# Patient Record
Sex: Female | Born: 1980 | Race: Black or African American | Hispanic: No | Smoking: Current some day smoker
Health system: Southern US, Community
[De-identification: ages and names within clinical notes are randomized; demographics above are authoritative.]

## PROBLEM LIST (undated history)

## (undated) DIAGNOSIS — I5189 Other ill-defined heart diseases: Secondary | ICD-10-CM

## (undated) DIAGNOSIS — D509 Iron deficiency anemia, unspecified: Secondary | ICD-10-CM

## (undated) DIAGNOSIS — D649 Anemia, unspecified: Secondary | ICD-10-CM

## (undated) DIAGNOSIS — I160 Hypertensive urgency: Secondary | ICD-10-CM

## (undated) DIAGNOSIS — R200 Anesthesia of skin: Secondary | ICD-10-CM

## (undated) DIAGNOSIS — K219 Gastro-esophageal reflux disease without esophagitis: Secondary | ICD-10-CM

## (undated) DIAGNOSIS — I10 Essential (primary) hypertension: Secondary | ICD-10-CM

## (undated) DIAGNOSIS — O1490 Unspecified pre-eclampsia, unspecified trimester: Secondary | ICD-10-CM

## (undated) HISTORY — PX: TUBAL LIGATION: SHX77

## (undated) HISTORY — PX: OTHER SURGICAL HISTORY: SHX169

---

## 2002-03-19 ENCOUNTER — Ambulatory Visit (HOSPITAL_COMMUNITY): Admission: RE | Admit: 2002-03-19 | Discharge: 2002-03-19 | Payer: Self-pay | Admitting: *Deleted

## 2002-03-19 ENCOUNTER — Encounter: Payer: Self-pay | Admitting: *Deleted

## 2002-04-16 ENCOUNTER — Ambulatory Visit (HOSPITAL_COMMUNITY): Admission: AD | Admit: 2002-04-16 | Discharge: 2002-04-16 | Payer: Self-pay | Admitting: *Deleted

## 2002-04-26 ENCOUNTER — Ambulatory Visit (HOSPITAL_COMMUNITY): Admission: AD | Admit: 2002-04-26 | Discharge: 2002-04-26 | Payer: Self-pay | Admitting: Obstetrics and Gynecology

## 2002-07-21 ENCOUNTER — Observation Stay (HOSPITAL_COMMUNITY): Admission: AD | Admit: 2002-07-21 | Discharge: 2002-07-21 | Payer: Self-pay | Admitting: *Deleted

## 2002-07-29 ENCOUNTER — Inpatient Hospital Stay (HOSPITAL_COMMUNITY): Admission: AD | Admit: 2002-07-29 | Discharge: 2002-08-01 | Payer: Self-pay | Admitting: *Deleted

## 2002-10-07 ENCOUNTER — Encounter: Payer: Self-pay | Admitting: Internal Medicine

## 2002-10-07 ENCOUNTER — Emergency Department (HOSPITAL_COMMUNITY): Admission: EM | Admit: 2002-10-07 | Discharge: 2002-10-07 | Payer: Self-pay | Admitting: Internal Medicine

## 2002-12-21 ENCOUNTER — Emergency Department (HOSPITAL_COMMUNITY): Admission: EM | Admit: 2002-12-21 | Discharge: 2002-12-21 | Payer: Self-pay | Admitting: Emergency Medicine

## 2002-12-21 ENCOUNTER — Encounter: Payer: Self-pay | Admitting: Emergency Medicine

## 2006-02-26 ENCOUNTER — Emergency Department (HOSPITAL_COMMUNITY): Admission: EM | Admit: 2006-02-26 | Discharge: 2006-02-26 | Payer: Self-pay | Admitting: Emergency Medicine

## 2007-02-13 ENCOUNTER — Ambulatory Visit (HOSPITAL_COMMUNITY): Payer: Self-pay | Admitting: Obstetrics & Gynecology

## 2007-02-13 ENCOUNTER — Encounter (HOSPITAL_COMMUNITY): Admission: RE | Admit: 2007-02-13 | Discharge: 2007-02-25 | Payer: Self-pay | Admitting: Obstetrics & Gynecology

## 2007-03-10 ENCOUNTER — Emergency Department (HOSPITAL_COMMUNITY): Admission: EM | Admit: 2007-03-10 | Discharge: 2007-03-10 | Payer: Self-pay | Admitting: Emergency Medicine

## 2007-03-29 ENCOUNTER — Inpatient Hospital Stay (HOSPITAL_COMMUNITY): Admission: AD | Admit: 2007-03-29 | Discharge: 2007-03-29 | Payer: Self-pay | Admitting: Gynecology

## 2007-04-02 ENCOUNTER — Inpatient Hospital Stay (HOSPITAL_COMMUNITY): Admission: EM | Admit: 2007-04-02 | Discharge: 2007-04-07 | Payer: Self-pay | Admitting: Obstetrics & Gynecology

## 2007-04-02 ENCOUNTER — Encounter: Payer: Self-pay | Admitting: Obstetrics & Gynecology

## 2008-03-12 ENCOUNTER — Emergency Department (HOSPITAL_COMMUNITY): Admission: EM | Admit: 2008-03-12 | Discharge: 2008-03-12 | Payer: Self-pay | Admitting: Emergency Medicine

## 2008-10-14 ENCOUNTER — Emergency Department (HOSPITAL_COMMUNITY): Admission: EM | Admit: 2008-10-14 | Discharge: 2008-10-14 | Payer: Self-pay | Admitting: Emergency Medicine

## 2010-06-08 ENCOUNTER — Other Ambulatory Visit
Admission: RE | Admit: 2010-06-08 | Discharge: 2010-06-08 | Payer: Self-pay | Source: Home / Self Care | Admitting: Obstetrics & Gynecology

## 2010-07-04 ENCOUNTER — Inpatient Hospital Stay (HOSPITAL_COMMUNITY)
Admission: AD | Admit: 2010-07-04 | Discharge: 2010-07-10 | DRG: 765 | Disposition: A | Discharge status: Home / Self Care | Payer: Medicaid Other | Source: Ambulatory Visit | Attending: Obstetrics and Gynecology | Admitting: Obstetrics and Gynecology

## 2010-07-04 ENCOUNTER — Inpatient Hospital Stay (HOSPITAL_COMMUNITY): Payer: Medicaid Other

## 2010-07-04 ENCOUNTER — Encounter (HOSPITAL_COMMUNITY): Payer: Self-pay

## 2010-07-04 DIAGNOSIS — IMO0002 Reserved for concepts with insufficient information to code with codable children: Principal | ICD-10-CM | POA: Diagnosis present

## 2010-07-04 DIAGNOSIS — O34219 Maternal care for unspecified type scar from previous cesarean delivery: Secondary | ICD-10-CM

## 2010-07-04 DIAGNOSIS — I10 Essential (primary) hypertension: Secondary | ICD-10-CM

## 2010-07-04 LAB — CBC
HCT: 26.8 % — ABNORMAL LOW (ref 36.0–46.0)
Hemoglobin: 8.8 g/dL — ABNORMAL LOW (ref 12.0–15.0)
MCH: 26.1 pg (ref 26.0–34.0)
MCHC: 32.8 g/dL (ref 30.0–36.0)
MCV: 79.5 fL (ref 78.0–100.0)
Platelets: 161 10*3/uL (ref 150–400)
RBC: 3.37 MIL/uL — ABNORMAL LOW (ref 3.87–5.11)
RDW: 14 % (ref 11.5–15.5)
WBC: 6.1 10*3/uL (ref 4.0–10.5)

## 2010-07-04 LAB — COMPREHENSIVE METABOLIC PANEL
ALT: 13 U/L (ref 0–35)
AST: 25 U/L (ref 0–37)
Albumin: 2.6 g/dL — ABNORMAL LOW (ref 3.5–5.2)
CO2: 20 mEq/L (ref 19–32)
Chloride: 108 mEq/L (ref 96–112)
GFR calc Af Amer: >60 mL/min (ref >60)
GFR calc non Af Amer: >60 mL/min (ref >60)
Sodium: 135 mEq/L (ref 135–145)
Total Bilirubin: 0.2 mg/dL — ABNORMAL LOW (ref 0.3–1.2)

## 2010-07-04 LAB — PROTEIN / CREATININE RATIO, URINE: Protein Creatinine Ratio: 0.18 — ABNORMAL HIGH (ref 0.00–0.15)

## 2010-07-04 LAB — RPR: RPR Ser Ql: NONREACTIVE

## 2010-07-05 ENCOUNTER — Inpatient Hospital Stay (HOSPITAL_COMMUNITY): Payer: Medicaid Other

## 2010-07-05 DIAGNOSIS — O34219 Maternal care for unspecified type scar from previous cesarean delivery: Secondary | ICD-10-CM

## 2010-07-05 DIAGNOSIS — IMO0002 Reserved for concepts with insufficient information to code with codable children: Secondary | ICD-10-CM

## 2010-07-05 DIAGNOSIS — I10 Essential (primary) hypertension: Secondary | ICD-10-CM

## 2010-07-05 LAB — CBC
HCT: 24.3 % — ABNORMAL LOW (ref 36.0–46.0)
Hemoglobin: 7.9 g/dL — ABNORMAL LOW (ref 12.0–15.0)
MCH: 26 pg (ref 26.0–34.0)
MCHC: 32.5 g/dL (ref 30.0–36.0)
RBC: 3.04 MIL/uL — ABNORMAL LOW (ref 3.87–5.11)

## 2010-07-05 LAB — COMPREHENSIVE METABOLIC PANEL
ALT: 10 U/L (ref 0–35)
AST: 21 U/L (ref 0–37)
CO2: 22 mEq/L (ref 19–32)
Calcium: 8 mg/dL — ABNORMAL LOW (ref 8.4–10.5)
Chloride: 109 mEq/L (ref 96–112)
GFR calc Af Amer: >60 mL/min (ref >60)
GFR calc non Af Amer: >60 mL/min (ref >60)
Glucose, Bld: 93 mg/dL (ref 70–99)
Sodium: 137 mEq/L (ref 135–145)
Total Bilirubin: 0.2 mg/dL — ABNORMAL LOW (ref 0.3–1.2)

## 2010-07-05 LAB — MRSA PCR SCREENING: MRSA by PCR: NEGATIVE

## 2010-07-05 LAB — CREATININE CLEARANCE, URINE, 24 HOUR: Creatinine Clearance: 178 mL/min — ABNORMAL HIGH (ref 75–115)

## 2010-07-06 ENCOUNTER — Inpatient Hospital Stay (HOSPITAL_COMMUNITY): Payer: Medicaid Other

## 2010-07-06 DIAGNOSIS — O34219 Maternal care for unspecified type scar from previous cesarean delivery: Secondary | ICD-10-CM

## 2010-07-06 DIAGNOSIS — IMO0002 Reserved for concepts with insufficient information to code with codable children: Secondary | ICD-10-CM

## 2010-07-06 DIAGNOSIS — I10 Essential (primary) hypertension: Secondary | ICD-10-CM

## 2010-07-06 LAB — PROTEIN, URINE, 24 HOUR
Collection Interval-UPROT: 24 hours
Protein, 24H Urine: 117 mg/d — ABNORMAL HIGH (ref 50–100)
Protein, Urine: 9 mg/dL
Urine Total Volume-UPROT: 1300 mL

## 2010-07-07 ENCOUNTER — Other Ambulatory Visit (HOSPITAL_COMMUNITY): Payer: Medicaid Other

## 2010-07-07 DIAGNOSIS — I10 Essential (primary) hypertension: Secondary | ICD-10-CM

## 2010-07-07 DIAGNOSIS — IMO0002 Reserved for concepts with insufficient information to code with codable children: Secondary | ICD-10-CM

## 2010-07-07 DIAGNOSIS — O34219 Maternal care for unspecified type scar from previous cesarean delivery: Secondary | ICD-10-CM

## 2010-07-07 DIAGNOSIS — Z302 Encounter for sterilization: Secondary | ICD-10-CM

## 2010-07-07 LAB — CBC
HCT: 28.8 % — ABNORMAL LOW (ref 36.0–46.0)
MCH: 26.3 pg (ref 26.0–34.0)
MCHC: 32.1 g/dL (ref 30.0–36.0)
MCV: 79.8 fL (ref 78.0–100.0)
Platelets: 149 10*3/uL — ABNORMAL LOW (ref 150–400)
RBC: 3.61 MIL/uL — ABNORMAL LOW (ref 3.87–5.11)
RDW: 14.8 % (ref 11.5–15.5)
RDW: 14.5 % (ref 11.5–15.5)
WBC: 6.2 10*3/uL (ref 4.0–10.5)
WBC: 8.3 10*3/uL (ref 4.0–10.5)

## 2010-07-07 LAB — COMPREHENSIVE METABOLIC PANEL
ALT: 11 U/L (ref 0–35)
AST: 23 U/L (ref 0–37)
Albumin: 2.2 g/dL — ABNORMAL LOW (ref 3.5–5.2)
Calcium: 7.7 mg/dL — ABNORMAL LOW (ref 8.4–10.5)
GFR calc Af Amer: >60 mL/min (ref >60)
Glucose, Bld: 92 mg/dL (ref 70–99)
Potassium: 3.4 mEq/L — ABNORMAL LOW (ref 3.5–5.1)
Sodium: 134 mEq/L — ABNORMAL LOW (ref 135–145)
Total Protein: 6.1 g/dL (ref 6.0–8.3)

## 2010-07-07 LAB — DIFFERENTIAL
Eosinophils Relative: 1 % (ref 0–5)
Lymphocytes Relative: 17 % (ref 12–46)
Lymphs Abs: 1.4 10*3/uL (ref 0.7–4.0)
Monocytes Relative: 4 % (ref 3–12)

## 2010-07-07 LAB — PREPARE RBC (CROSSMATCH)

## 2010-07-08 DIAGNOSIS — O34219 Maternal care for unspecified type scar from previous cesarean delivery: Secondary | ICD-10-CM

## 2010-07-08 DIAGNOSIS — IMO0002 Reserved for concepts with insufficient information to code with codable children: Secondary | ICD-10-CM

## 2010-07-08 DIAGNOSIS — I10 Essential (primary) hypertension: Secondary | ICD-10-CM

## 2010-07-08 LAB — CBC
Hemoglobin: 9.1 g/dL — ABNORMAL LOW (ref 12.0–15.0)
MCH: 26.4 pg (ref 26.0–34.0)
MCHC: 32.9 g/dL (ref 30.0–36.0)
MCV: 80.3 fL (ref 78.0–100.0)
RBC: 3.45 MIL/uL — ABNORMAL LOW (ref 3.87–5.11)

## 2010-07-09 DIAGNOSIS — I10 Essential (primary) hypertension: Secondary | ICD-10-CM

## 2010-07-09 DIAGNOSIS — O34219 Maternal care for unspecified type scar from previous cesarean delivery: Secondary | ICD-10-CM

## 2010-07-09 DIAGNOSIS — IMO0002 Reserved for concepts with insufficient information to code with codable children: Secondary | ICD-10-CM

## 2010-07-10 DIAGNOSIS — O34219 Maternal care for unspecified type scar from previous cesarean delivery: Secondary | ICD-10-CM

## 2010-07-10 DIAGNOSIS — I10 Essential (primary) hypertension: Secondary | ICD-10-CM

## 2010-07-10 DIAGNOSIS — IMO0002 Reserved for concepts with insufficient information to code with codable children: Secondary | ICD-10-CM

## 2010-07-10 LAB — TYPE AND SCREEN
ABO/RH(D): A POS
Unit division: 0

## 2010-07-17 NOTE — Op Note (Signed)
[name], [name]                ACCOUNT NO.:  [id number]  MEDICAL RECORD NO.:  [id number]         PATIENT TYPE:  WINP  LOCATION:  159                           FACILITY:  [facility]  PHYSICIAN:  Maryelizabeth Kaufmann, MD  DATE OF BIRTH:  1981/05/22  DATE OF PROCEDURE:  07/07/2010 DATE OF DISCHARGE:                              OPERATIVE REPORT   PREOPERATIVE DIAGNOSES: 1. Intrauterine pregnancy at 36 weeks and 0 days. 2. Previous cesarean section  x2. 3. Desired permanent sterilization. 4. Chronic hypertension with superimposed severe preeclampsia.  POSTOPERATIVE DIAGNOSES: 1. Intrauterine pregnancy at 36 weeks and 0 days. 2. Previous cesarean section  x2. 3. Desired permanent sterilization. 4. Chronic hypertension with superimposed severe preeclampsia.  PROCEDURE:  Repeat low transverse cesarean section with bilateral tubal occlusion with Filshie clips via Pfannenstiel skin incision.  SURGEON:  Dr. Marice Potter and Dr. Orvan Falconer.  ANESTHESIA:  Spinal.  IV FLUIDS:  800 mL.  URINE OUTPUT:  850 mL of clear urine at the end of procedure.  ESTIMATED BLOOD LOSS:  500 mL.  SPECIMENS:  Placenta that was sent to Labor and Delivery.  FINDINGS:  A viable female infant in vertex presentation with clear amniotic fluid.  Normal uterus, tubes, and ovaries.  Apgars were 6 and 9 and weight was 6 pounds 12 ounces.  INDICATIONS:  This is a 30 year old gravida 3, para 2-0-0-2 with history of previous C-section x2 presenting with chronic hypertension.  The patient was admitted for observation for rule out superimposed preeclampsia.  We did have difficult to control blood pressures in which a third agent was added.  BPP remained stable at 8/8.  Amniotic fluid index remained normal.  Estimated fetal weight was greater than the 90th percentile and was estimated to be 7 pounds and 7 ounces.  Subsequently, the patient did develop worsening symptoms despite adequate medication on 3 agents.  Her systolic went  up to 215/115 diastolic, decision was made at that time that the patient was severe preeclampsia.  The patient was started on magnesium for seizure prophylaxis, due to the patient's previous history of previous C-section x2.  She decided she went to proceed with a repeat C-section and also wanted a bilateral tubal occlusion.  Risks, benefits, and alternatives were discussed with the patient including but not limited to the risk of bleeding, pain, infection, and the risk of failure and ectopic and she decided to proceed with the aforementioned procedures.  PROCEDURE IN DETAIL:  After the patient was taken back to the OR suite and anesthesia was found to be adequate, the patient was placed in dorsal supine position with a leftward tilt.  The patient was prepped and draped in a normal sterile fashion.  A Pfannenstiel skin incision was then made with a scalpel and it was carried down through the underlying fascia.  The fascia was then incised in the midline.  The fascial incision was then extended laterally with the Mayo scissors. Then using the cautery, the rectus muscles were then incised in a transverse fashion approximately midway, the peritoneum was then identified and was entered bluntly.  The rectus muscles and peritoneum were then entered bluntly  and incision was made.  The bladder blade was then inserted.  The lower uterine segment was incised in a transverse fashion with a scalpel.  Amniotomy was then performed with clear fluid. Infant was found to be in vertex presentation, infant was delivered otherwise atraumatically.  Mouth and nose were bulb suctioned.  Cord was clamped and handed off to awaiting NICU.  Apgars were 6 and 9.  Weight was 6 pounds 12 ounces.  Cord blood was sampled.  Placenta was then delivered otherwise spontaneously intact with a three-vessel cord.  The uterus was then cleared of all clots and debris.  The uterine incision was then closed with a 2-0 Vicryl in a  running locking fashion. Hemostasis was then found to be adequate.  Attention was then turned to bilateral adnexa, at which time the right adnexum was then visualized. The right fallopian tube was then grasped with a Babcock clamp and it was followed out to the fimbriae and a Filshie clip was then deployed in the isthmic region and fallopian tube on the right was then returned to the abdomen.  Attention was then turned to the left fallopian tube which in similar fashion was grasped with a Babcock clamp and was followed out the fimbriae and the Filshie clip was deployed in the isthmic region.   The uterus and tube on that side was also returned to the abdomen.  Attention was then turned to the uterine incision which required an additional second layer closure.  Hemostasis was then found to be adequate using the same 2-0 Vicryl in a running locking fashion.  The peritoneum was then cleared of all clots and debris.  The fascial incision was then closed with a PDS in a running fashion.  The subcutaneous tissue was then irrigated and was found to be hemostatic.  The skin was then closed.  Additional local anesthesia was then used with 0.5% Marcaine in subcuticular region for additional local hemostasis.  The subcutaneous tissue was then closed with Vicryl in a subcutaneous fashion.  Lap, needle, and sponge counts were correct x2.  The patient tolerated the procedure well.  There were no complications.  The patient did receive perioperative antibiotics, and the patient was taken back to the recovery area in stable condition.          ______________________________ Maryelizabeth Kaufmann, MD     LC/MEDQ  D:  07/07/2010  T:  07/07/2010  Job:  [id number]  Electronically Signed by Maryelizabeth Kaufmann MD on 07/17/2010 12:32:11 PM

## 2010-07-27 NOTE — Discharge Summary (Signed)
  [name], [name]                ACCOUNT NO.:  [id number]  MEDICAL RECORD NO.:  [id number]           PATIENT TYPE:  I  LOCATION:  9130                          [facility]ACILITY:  [facility]  PHYSICIAN:  Tilda Burrow, M.D. DATE OF BIRTH:  [birth year]  DATE OF ADMISSION:  07/04/2010 DATE OF DISCHARGE:  07/10/2010                              DISCHARGE SUMMARY   REASON FOR ADMISSION:  Pregnancy at 36 weeks with uncontrolled hypertension and superimposed preeclampsia.  [name] was placed on the antenatal unit in attempt to assess her chronic hypertension and her superimposed preeclampsia.  After several days of pressures in 200s with diastolic of 96-120s and change in medicine regimen.  It was felt that delivery needed to be done for the patient well being.  At that time, the patient's blood pressure was in the severe range.  Cesarean section was decided by Dr. Marice Potter.  The patient had a low-transverse cesarean section and bilateral tubal ligation with Filshie clips by Dr. Marice Potter and Dr. Orvan Falconer on the July 07, 2010, uncomplicated, had a female child with Apgars of 6 and 9.  Postop course has been complicated by uncontrolled hypertension, which now with medication change pressures are down to the 130s to 150s over 80s to low 100s depending on her activity level.  She is up ambulating well, voiding without difficulty. Denies complaints and desires discharge home today.  DISCHARGE MEDICATIONS: 1. Hydrochlorothiazide 25 p.o. q.a.m. 2. Lisinopril 20 mg p.o. daily. 3. Norvasc 10 p.o. daily. 4. Metoprolol 100 mg b.i.d. 5. Percocet 5/325 p.o. q.4 h. p.r.n. pain. 6. Motrin p.o. q.6 h. p.r.n. cramping.  FOLLOWUP:  She is to follow up with Dr. Rayna Sexton office in a week for blood pressure check and also appearing any problems.  ACTIVITY LEVEL:  No heavy lifting or driving for at least 2 weeks postop.  DIET:  Diet as tolerated.  PHYSICAL EXAMINATION:  VITAL SIGNS:  Today, BP is 150/80. HEART:   Regular rhythm and rate. LUNGS:  Clear to auscultation bilaterally. ABDOMEN:  Soft.  Bowel sounds present in all four quadrants.  Incision is intact.  There is no redness or drainage.  Fundus is firm.  Lochia small amount.  There is trace edema in lower extremities.     Zerita Boers, N.M.   ______________________________ Tilda Burrow, M.D.    DL/MEDQ  D:  16/02/9603  T:  07/10/2010  Job:  [id number]  cc:   [facility]  Electronically Signed by Wyvonnia Dusky N.M. on 07/23/2010 10:29:18 AM Electronically Signed by Christin Bach M.D. on 07/27/2010 08:44:06 PM

## 2010-08-04 ENCOUNTER — Inpatient Hospital Stay (HOSPITAL_COMMUNITY): Admission: AD | Admit: 2010-08-04 | Payer: Self-pay | Admitting: Obstetrics & Gynecology

## 2010-10-10 NOTE — Discharge Summary (Signed)
[name], [name]                ACCOUNT NO.:  [id number]   MEDICAL RECORD NO.:  [id number]          PATIENT TYPE:  INP   LOCATION:  9125                          [facility]ILITY:  [facility]   PHYSICIAN:  Tilda Burrow, M.D. DATE OF BIRTH:  25-Jun-1980   DATE OF ADMISSION:  04/02/2007  DATE OF DISCHARGE:                               DISCHARGE SUMMARY   REASON FOR ADMISSION:  Pregnancy at 40 weeks, 1 day with chronic  hypertension for repeat Cesarean section.   PROCEDURE:  The patient had a repeat low transverse Cesarean section  without complications.   HOSPITAL COURSE:  Hospital course has been complicated by difficulty  managing blood pressures.  Her pressures have been anywhere from 150's  to 160's/low 100's.  On April 06, 2007 her medication was changed from  Aldomet to lisinopril 10 mg p.o. q.a.m. and the patient has responded  much better with that.  On physical examination today her blood pressure  is 140's/80's.  Heart is regular to rhythm and rate.  Her lungs are  clear to auscultation bilaterally.  Abdomen is soft, nontender.  Bowel  sounds are present.  Incision is intact.  No redness, swelling or  drainage.  There is a scant amount of lochia.  There is no edema in the  lower extremities.  Deep tendon reflexes are 2+.   PLAN:  We are going to discharge her home to follow up in one week with  [facility] for staple removal and blood pressure evaluation.   DISCHARGE MEDICATIONS:  Her discharge medications are as follows:  1. Lisinopril 10 mg p.o. q.a.m.  2. Hydrochlorothiazide 25 mg p.o. q.a.m.  3. Norvasc 5 mg p.o. q.h.s.  4. Percocet 5/325 one q.4h. p.r.n. pain.  5. Motrin 600 mg one p.o. q.6h. p.r.n. discomfort.  6. Colace two p.o. q.h.s.      Zerita Boers, Lanier Clam      Tilda Burrow, M.D.  Electronically Signed    DL/MEDQ  D:  04/54/0981  T:  04/07/2007  Job:  [id number]

## 2010-10-10 NOTE — Op Note (Signed)
[name], [name]                ACCOUNT NO.:  [id number]   MEDICAL RECORD NO.:  [id number]          PATIENT TYPE:  INP   LOCATION:  9125                          [facility]  [facility]   PHYSICIAN:  Lazaro Arms, M.D.   DATE OF BIRTH:  12-27-1980   DATE OF PROCEDURE:  04/02/2007  DATE OF DISCHARGE:                               OPERATIVE REPORT   PREOPERATIVE DIAGNOSES:  1. Intrauterine pregnancy at 40-1/[redacted] weeks gestation.  2. Chronic hypertension.  3. Previous cesarean section.  4. Declines trial of labor with unfavorable cervix.   POSTOPERATIVE DIAGNOSES:  1. Intrauterine pregnancy at 40-1/[redacted] weeks gestation.  2. Chronic hypertension.  3. Previous cesarean section.  4. Declines trial of labor with unfavorable cervix.   OPERATION PERFORMED:  Repeat cesarean section.   SURGEON:  Lazaro Arms, M.D.   ASSISTANT:  Tyrone Apple. Sharol Harness, M.D.   ANESTHESIA:  Spinal.   FINDINGS:  Over a low transverse hysterotomy incision was delivered a  viable female infant at 30 with Apgars of 6 and 8, weighing 7 pounds and  4 ounces, three-vessel cord, cord blood was sent.  Cord gas pH 7.26.  Uterus, tubes and ovaries normal.   DESCRIPTION OF PROCEDURE:  The patient was taken to the operating room  and placed in sitting position where she underwent spinal anesthetic.  She was then placed in supine position with a left uterine tilt where  she was tilted to her left.  She was prepped and draped in the usual  sterile fashion.  Foley catheter was placed.  A Pfannenstiel skin  incision was made and carried down sharply.  Rectus fascia was scored in  the midline and extended laterally.  Fascia dissected off the muscles  superiorly and inferiorly without difficulty.  Muscles were divided.  Peritoneal cavity was entered.  Bladder flap was created.  A bladder  blade was placed.  A low transverse hysterotomy incision was made.  Over  this incision was delivered a viable female infant at 67 with Apgars  of  6 and 8, weighing 7 pounds, 4 ounces.  Cord gas was 7.26.  Three-vessel  cord, placenta was normal.  Uterus, tubes and ovaries were normal.  The  infant underwent routine neonatal resuscitation.  Placenta was delivered  spontaneously.  The uterus was clean with lap pad and exteriorized,  closed in two layers, first being running interlocking layer, the second  being an imbricating layer.  There was good hemostasis and the pelvis  was irrigated vigorously.  Muscles of the peritoneum were reapproximated  loosely.  The fascia was closed using 0 Vicryl running, subcutaneous  tissue  made hemostatic and irrigated.  Skin was closed using skin staples.  The  patient tolerated the procedure well.  She experienced 600 mL of blood  loss.  Taken to recovery room in good and stable condition.  All counts  correct at end of procedure.      Lazaro Arms, M.D.  Electronically Signed     LHE/MEDQ  D:  04/02/2007  T:  04/03/2007  Job:  [id number]

## 2010-10-10 NOTE — H&P (Signed)
[name], [name]                ACCOUNT NO.:  [id number]   MEDICAL RECORD NO.:  [id number]          PATIENT TYPE:  INP   LOCATION:  9125                          [facility]ILITY[facility]   PHYSICIAN:  Lazaro Arms, M.D.   DATE OF BIRTH:  Jul 09, 1980   DATE OF ADMISSION:  04/02/2007  DATE OF DISCHARGE:                              HISTORY & PHYSICAL   [name] is a 30 year old African American female, gravida 2, para 1,  abortus zero, estimated date of delivery April 01, 2007, chronic  hypertension who had a previous C-section and wanted to have a vaginal  birth after cesarean.  She had a C-section for a 7 pounds 6 ounces baby  before for failure to progress.  Her cervix was woefully unfavorable  this time.  We waited this long in order to see if she would go into  labor.  The baby is well out of the pelvis and I did not feel  comfortable inducing her.  And, she agreed with proceeding with a repeat  C-section.   PAST MEDICAL HISTORY:  Significant for hypertension.   PAST SURGERY:  1. Gallbladder.  2. C-section.   PAST OBSTETRICAL HISTORY:  C-section in 2004, 7 pounds 6 ounces.   REVIEW OF SYSTEMS:  Negative.   MEDICATIONS:  Aldomet 500 b.i.d. increased to 1,000 b.i.d. here in the  last 3 weeks.   Her protein has been trace.  Her 24-hour urine has been negative.   She was also anemic during the pregnancy and I gave her some IV iron  because she was unable to tolerate the oral.   Blood type is A positive.  Rubella is immune.  Anti__________  titer was  positive but the titer was less than 2 and that was repeated.  Hepatitis  B was negative.  HIV was nonreactive x2.  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  HPV was negative.  Serologies nonreactive x2.  Pap was normal.  GC and  Chlamydia were negative x2.  AFP, she was too late.  Group B strep was  positive.   PHYSICAL EXAMINATION:  HEENT:  Unremarkable.  Thyroid is normal.  LUNGS:  Clear.  HEART:  Regular rate and rhythm without murmur,  regurgitation, or  gallop.  BREASTS:  Deferred.  ABDOMEN:  Fundal height of 40-cm.  PELVIC:  Cervix is long, thick, closed, and high.  EXTREMITIES:  Warm with no edema.  NEUROLOGIC:  Grossly intact.   IMPRESSION:  1. Intrauterine pregnancy at 40 weeks and 1 day.  2. Chronic hypertension.  3. Previous cesarean section.   PLAN:  The patient is admitted for a repeat C-section.  She understands  risks, benefits, indications, and alternatives.  We will proceed.      Lazaro Arms, M.D.  Electronically Signed     LHE/MEDQ  D:  04/02/2007  T:  04/02/2007  Job:  [id number]
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
## 2010-10-13 NOTE — Op Note (Signed)
   NAME:  [name], [name]                          ACCOUNT NO.:  [id number]   MEDICAL RECORD NO.:  [id number]                   PATIENT TYPE:  INP   LOCATION:  LDR1                                 FACILITY:  [facility]   PHYSICIAN:  Langley Gauss, M.D.                DATE OF BIRTH:  10-30-1980   DATE OF PROCEDURE:  DATE OF DISCHARGE:                                 OPERATIVE REPORT   OBSTETRICAL PROCEDURE NOTE:   PROCEDURE:  Placement of a continuous lumbar epidural analgesia.   START TIME:  07/29/2002 at 2330.   SUMMARY:  An appropriate and informed consent was obtained.  Continuous  electronic fetal monitoring was performed.  The patient was placed in the  seated position at which time bony landmarks were identified.  The L3-L4  interspace was chosen.  The patient's back was sterilely prepped and draped  utilizing the epidural kit; 5 mL of 1% lidocaine plain was injected at the  midline of the L3-L4 interspace to raise a small skin wheal.   The 17-gauge Touhy-Schliff needle was then utilized with loss of resistance  in air-filled glass syringe to identify entry into the epidural space on the  first attempt without difficulty.  Excellent loss of resistance was noted.  Initial test dose of 5 mL of 1.5% lidocaine plus epinephrine is administered  as initial test dose.  No signs of CSF or intravascular injection obtained.  Thus, the epidural catheter was inserted to a depth of 5 cm.  Epidural  needle was removed.  Aspiration test was negative.  Second test dose of 2 mL  of 1.5% lidocaine plus epinephrine injected through the epidural catheter.  Again, no signs of CSF or intravascular injection obtained.   The patient having tingling in the feet consistent with proper setting up of  epidural block.  She is returned to the lateral supine position at which  time she is connected to the infusion pump containing the standard mixture.  She will be treated with a bolus of 10 mL followed by  continuous infusion  rate of 14 mL/hour.  Examination immediately following the procedure  indicates cervix 4 cm dilated, 70% effaced, vertex at a minus 1 station.  External fetal monitor reveals reassuring fetal heart rate.  Uterine  contractions are occurring every 3-5 minutes.  They are only 30 seconds  duration; thus, at this point in time, Pitocin augmentation will be  initiated.                                               Langley Gauss, M.D.    DC/MEDQ  D:  07/30/2002  T:  07/30/2002  Job:  [id number]
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
## 2010-10-13 NOTE — Op Note (Signed)
   NAME:  [name], [name]                          ACCOUNT NO.:  [id number]   MEDICAL RECORD NO.:  [id number]                   PATIENT TYPE:  INP   LOCATION:  LDR1                                 FACILITY:  [facility]   PHYSICIAN:  Langley Gauss, M.D.                DATE OF BIRTH:  1980-12-09   DATE OF PROCEDURE:  DATE OF DISCHARGE:                                 OPERATIVE REPORT   PROCEDURE:  Placement of a Foley bulb for mechanical ripening of cervix  prior to induction.   COMPLICATIONS:  None.   SUMMARY:  The patient was admitted for induction of labor for findings of  gestational hypertension with blood pressures of 140s/90s during the 37th  and 38th week of gestation.  Due to the unfavorable nature of the cervix, a  Foley bulb is placed for mechanical ripening of the cervix.  The cervix was  examined and noted to be 1 cm dilated, -1 station, 60% effaced.  External  fetal monitor reveals a reassuring fetal heart rate with a reactive  nonstress test.  No uterine activity was identified and normal uterine tone.   PROCEDURE:  The patient was placed in the dorsal lithotomy position.  Sterile speculum examination was performed.  The cervix was visualized.  There was noted to be no leakage of fluid and no vaginal bleeding.  A 16  French Foley catheter was then passed through the endocervical os into the  lower uterine segment and inflated to a volume of 50 cc of sterile normal  saline.  The speculum was then removed.  Gentle traction was applied to the  catheter, and it was taped to the patient's left leg.  Following the  procedure, the patient was reconnected to the fetal monitor.  Fetal heart  rate remained reassuring with fetal heart rate accelerations noted.  No  change in uterine tone, specifically no increased uterine activity, no  vaginal bleeding, and no leakage of fluid.                                               Langley Gauss, M.D.    DC/MEDQ  D:  07/29/2002  T:   07/29/2002  Job:  [id number]

## 2010-10-13 NOTE — Discharge Summary (Signed)
   NAME:  [name], KOTT NO.:  [id number]   MEDICAL RECORD NO.:  [id number]                   PATIENT TYPE:  OBV   LOCATION:  A415                                 FACILITY:  [facility]   PHYSICIAN:  Langley Gauss, M.D.                DATE OF BIRTH:  [birth year]   DATE OF ADMISSION:  04/16/2002  DATE OF DISCHARGE:  04/16/2002                                 DISCHARGE SUMMARY   HISTORY OF PRESENT ILLNESS:  This is a 29 year old gravida 1, para 0, at  about [redacted] weeks gestation who presents to [facility] complaining of  lower pelvic pain of several days' duration, slightly increasing today.  She  states that the pain is steady in nature, on the left greater than on the  right.  She denies any uterine-type contractions or menstrual-type cramping.  The patient's prenatal course otherwise is uncomplicated to date.  She  denies any vaginal bleeding or leakage of fluid.   PAST MEDICAL HISTORY:  Otherwise noncontributory.   PHYSICAL EXAMINATION:  GENERAL:  Patient in no acute distress.  Mild  distress with palpation of the lower uterine segment.  VITAL SIGNS:  Temperature 97.6, pulse 89, respiratory rate 20, blood  pressure 136/89.  ABDOMEN:  Uterus soft and nontender.  Fundal height consistent with 23  weeks' gestation.  PELVIC:  With normal external genitalia.  No lesions or ulcerations  identified.  Digital examination of the cervix shows it closed.  No leakage  of fluid.  No vaginal bleeding identified.  External fetal monitor reveals  fetal heart rate baseline 140-150, with complete absence of uterine  activity.   HOSPITAL COURSE:  The patient was noted to have the mild suprapubic pain on  examination.  She was treated with 2 p.o. Tylox x 1 with complete cessation  of discomfort.  She was observed for an additional several hours' duration  with no recurrence of the pain.  She was thus discharged to home on the same  date of service.                                    Langley Gauss, M.D.    DC/MEDQ  D:  05/03/2002  T:  05/04/2002  Job:  [id number]

## 2010-10-13 NOTE — Op Note (Signed)
   [name], [name]                            ACCOUNT NO.:  [id number]   MEDICAL RECORD NO.:  [id number]                  PATIENT TYPE:   LOCATION:  418                                  FACILITY:   PHYSICIAN:  Langley Gauss, M.D.                DATE OF BIRTH:   DATE OF PROCEDURE:  07/29/2002  DATE OF DISCHARGE:                                 OPERATIVE REPORT   PROCEDURE:  Removal of a Foley bulbar catheter from the cervix.   DESCRIPTION OF PROCEDURE:  The patient had the Foley bulb catheter placed  earlier today.  During the last hour, she has had the onset of uterine  contractions, occurring 2-3 minutes, so our reach is only lasting 30 seconds  in duration.  The heart rate remains reassuring.  There is no change in the  baseline uterine tone.  The patient had received IV Nubain and IV Phenergan  for discomfort after the Foley bulb had been placed.  I initially attempted  to remove the Foley bulb without removing any of the fluid; however, this  was unsuccessful, and I had to remove 10 mL, leaving a volume of 40 mL of  sterile normal saline within the Foley bulb.  This was then removed without  complication.  Cervical exam following removal, 3 cm dilated, -1 station,  70% effaced, with a vertex presentation palpable.  A fetal scalp electrode  is placed with resultant amniotomy and findings of clear amniotic fluid.  Fetal scalp electrode at this time documents a reassuring fetal heart rate.   PLAN:  The patient will be observed x 1 hour duration.  If adequate labor  does not ensue, we will proceed with Pitocin augmentation at this time.                                               Langley Gauss, M.D.    DC/MEDQ  D:  07/29/2002  T:  07/30/2002  Job:  [id number]

## 2010-10-13 NOTE — Op Note (Signed)
NAME:  [name], [name]                          ACCOUNT NO.:  [id number]   MEDICAL RECORD NO.:  [id number]                   PATIENT TYPE:  INP   LOCATION:  LDR1                                 FACILITY:  [facility]   PHYSICIAN:  Langley Gauss, M.D.                DATE OF BIRTH:  [birth year]   DATE OF PROCEDURE:  DATE OF DISCHARGE:                                 OPERATIVE REPORT   PREOPERATIVE DIAGNOSES:  1. A 38-week intrauterine pregnancy.  2. Gestational hypertension.  3. Fetal heart rate decelerations.   POSTOPERATIVE DIAGNOSES:  1. A 38-week intrauterine pregnancy.  2. Gestational hypertension.  3. Fetal heart rate decelerations.   PROCEDURE:  Primary low transverse cesarean section.   SURGEON:  Roylene Reason.  Lisette Grinder, M.D.   FINDINGS:  Delivered a 6 pound 3 ounce female infant.   ESTIMATED BLOOD LOSS:  700 mL.   ANESTHESIA:  Labor epidural which had been placed by Dr. Roylene Reason. Lisette Grinder.  It was then managed by Farris Has, CRNA for purposes of the operative  delivery.   DRAINS:  1. JP drain in the subcutaneous space.  2. Foley catheter to straight drainage.   PEDIATRICIAN:  Pediatric care provided by Dr. Tanja Port neonatologist.   INDICATIONS FOR PROCEDURE:  The patient had been admitted at [redacted] weeks  gestation for induction of labor secondary to findings of gestational  hypertension with blood pressure of 130/100.  Initially a Foley bulb  catheter had been placed for mechanical ripening of the cervix.  With  removal of the Foley catheter, thereafter performed amniotomy with findings  of clear amniotic fluid.  Fetal scalp electrode was placed.  Pitocin  augmentation was initiated due to inadequate frequency and intensity of  uterine contractions.  The patient had a reassuring fetal heart rate but  then began having some mild variable decelerations. When I examined the  patient she was noted to be 5 cm dilated, 70% effaced with the vertex at  minus 1 station, but not very  well applied to the cervix.  The patient,  thereafter began developing fetal heart rate decelerations with fetal heart  rate decelerating to 80-90 beats/minute in a variable-type deceleration with  a compensatory over shoot following this with a fetal heart rate of 190-200.  This continued and was watched for 12 minutes duration at which time it was  determined that the fetus was very poorly tolerant of distress which was  associated with labor, thus decision was made to proceed primary low  transverse cesarean section.   DESCRIPTION OF PROCEDURE:  The patient was taken to the operating room,  placed on the OR table with a slight left lateral tilt.  Foley catheter  continued to drain clear yellow urine.  After assurance of adequate surgical  analgesia, a sharp knife was used to incise the Pfannenstiel incision  through the skin, dissecting down to the fascial plane using a  sharp knife,  and cauterizing all bleeders along the way.  The fascia was then incised in  a transverse curvilinear manner utilizing the Mayo scissors.  The rectus  fascia was then dissected off the underlying rectus muscle in an avascular  plane.  The rectus fascial edges were then grasped using straight Kocher  clamps, dissected off the underlying rectus muscle in the midline both  superiorly and inferiorly.  The rectus muscle was bluntly separated.  The  peritoneal cavity was atraumatically, bluntly entered at the superior-most  portion of the incision.  The peritoneal incision then extended superiorly  and inferiorly; inferiorly we directly visualized the bladder to avoid its  accidental entry.   A bladder blade was then placed.  The lower uterine segment was noted to be  very well-developed.  A bladder flap was then created from the vesicouterine  fold in the avascular plane.  A sharp knife was then used to score a low  transverse uterine incision.  Amniotic sac was encountered in the midline.  Allis clamp was  used to rupture the sac with findings of clear amniotic  fluid.  No odor is identified.   My index finger is then used to bluntly extend the uterine incision  bilaterally.  The infant is noted to be in a cephalic presentation, but the  position is noted to be LOT.  My right hand then reached into the uterine  cavity and the head of the infant was flexed and elevated to the level of  the skin incision, at which time, the disposable suction was then placed on  the infant's vertex to allow suction.  Very gentle traction was applied  which resulted in easy deliver the vertex through the uterine incision  without difficulty.  The mouth and nares were bulb suctioned of clear  amniotic fluid.  Renewed gentle traction resulted in delivery of the  remainder of the infant through the uterine incision without difficulty.  The umbilical cord is then milked towards the infant.  The cord is doubly  clamped and cut and the infant is handed to the waiting neonatologist.  Arterial cord gas and cord blood are then obtained.   Gentle traction on the umbilical cord results in separation which, upon  examination, appears to be an intact 3-vessel cord and attached placenta.  The uterus is then exteriorized.  Intrauterine exploration reveals no  retained placental fragments. The tubes and ovaries noted to be normal in  appearance.  A very fine, filmy, adhesive disease was encountered in the  posterior aspect of the uterus which is dissected utilizing the cautery.  With the uterus exteriorized, uterine incision is noted not to have  extended.  This was easily closed utilizing 2 layers of #0 chromic in a  running lock fashion the second layer being an imbricating layer.  Small  bleeding is noted at the left apex, thus an additional figure-of-eight  suture is required here.   After assurance of adequate surgical analgesia along the uterine incision, the cul-de-sac was irrigated free of all clots.  The uterus was  returned to  the pelvic cavity.  The peritoneal edges were grasped using Kelly clamps.  The peritoneal gutters are irrigated free of all clots with a sterile saline  solution.  Sponge, needle, and instrument counts are correct x2, at this  point.  The peritoneum is then closed with a continuous running #0 chromic  suture. The rectus muscle is reapproximated utilizing continuous running #0  chromic suture.  No  subfascial bleeders are identified.  The fascia is then  closed with a continuous running, #1 PDS suture.  A JP drain is placed  within the subcutaneous space with a separate exit wound to the right apex  of the incision.  The JP drain was sutured into place.   Three interrupted #1 PDS sutures are then placed along the skin edges to  function as retention-type sutures; and the skin is completely closed  utilizing skin staples.  To facilitate postoperative analgesia a total of 25  mL of 0.5% bupivacaine plain is injected along the entirety of the uterine  incision.  The patient tolerated the procedure very well.  She continues to drain clear yellow urine.  She is taken to the recovery  room in stable condition.  She plans on bottle feeding.  The operative  findings discussed with the patient's awaiting family on the fourth floor.  The infant is doing well in the newborn nursery.                                               Langley Gauss, M.D.    DC/MEDQ  D:  07/30/2002  T:  07/30/2002  Job:  [id number]

## 2011-02-26 LAB — CBC
Hemoglobin: 13.3
MCHC: 33.8
RBC: 4.51
WBC: 5.1

## 2011-02-26 LAB — URINALYSIS, ROUTINE W REFLEX MICROSCOPIC
Bilirubin Urine: NEGATIVE
Glucose, UA: NEGATIVE
Ketones, ur: NEGATIVE
pH: 7

## 2011-02-26 LAB — DIFFERENTIAL
Lymphocytes Relative: 13
Lymphs Abs: 0.7
Monocytes Absolute: 0.3
Monocytes Relative: 6
Neutro Abs: 4.1
Neutrophils Relative %: 80 — ABNORMAL HIGH

## 2011-02-26 LAB — BASIC METABOLIC PANEL
CO2: 26
Calcium: 9.1
Creatinine, Ser: 0.75
GFR calc Af Amer: >60
Sodium: 138

## 2011-02-26 LAB — URINE MICROSCOPIC-ADD ON

## 2011-03-06 LAB — URINE MICROSCOPIC-ADD ON

## 2011-03-06 LAB — CBC
HCT: 27.3 — ABNORMAL LOW
HCT: 32.1 — ABNORMAL LOW
MCHC: 34.2
MCHC: 34.9
MCV: 84.6
MCV: 85.6
MCV: 85.7
MCV: 86.5
Platelets: 122 — ABNORMAL LOW
Platelets: 133 — ABNORMAL LOW
Platelets: 138 — ABNORMAL LOW
Platelets: 150
RBC: 3.17 — ABNORMAL LOW
RBC: 3.55 — ABNORMAL LOW
RBC: 3.74 — ABNORMAL LOW
RDW: 18 — ABNORMAL HIGH
WBC: 5.7
WBC: 5.9
WBC: 6
WBC: 7.6

## 2011-03-06 LAB — URINALYSIS, ROUTINE W REFLEX MICROSCOPIC
Bilirubin Urine: NEGATIVE
Glucose, UA: NEGATIVE
Hgb urine dipstick: NEGATIVE
Specific Gravity, Urine: 1.015
Urobilinogen, UA: 0.2
pH: 6.5

## 2011-03-06 LAB — TYPE AND SCREEN
ABO/RH(D): A POS
Antibody Screen: NEGATIVE

## 2011-03-06 LAB — COMPREHENSIVE METABOLIC PANEL
ALT: 15
AST: 19
AST: 26
Albumin: 2.4 — ABNORMAL LOW
Albumin: 2.8 — ABNORMAL LOW
Alkaline Phosphatase: 123 — ABNORMAL HIGH
BUN: 3 — ABNORMAL LOW
BUN: 5 — ABNORMAL LOW
CO2: 23
Calcium: 9
Calcium: 9
Chloride: 105
Chloride: 105
Chloride: 108
Creatinine, Ser: 0.46
Creatinine, Ser: 0.49
GFR calc Af Amer: >60
GFR calc non Af Amer: >60
Potassium: 3.9
Sodium: 137
Total Bilirubin: 0.3
Total Bilirubin: 0.3
Total Bilirubin: 0.5

## 2011-03-06 LAB — RAPID URINE DRUG SCREEN, HOSP PERFORMED
Amphetamines: NOT DETECTED
Benzodiazepines: NOT DETECTED

## 2011-03-06 LAB — URIC ACID: Uric Acid, Serum: 4.5

## 2011-03-06 LAB — RPR: RPR Ser Ql: NONREACTIVE

## 2011-03-06 LAB — PREGNANCY, URINE: Preg Test, Ur: POSITIVE

## 2011-03-13 ENCOUNTER — Encounter (HOSPITAL_COMMUNITY): Payer: Self-pay | Admitting: *Deleted

## 2014-03-29 ENCOUNTER — Encounter (HOSPITAL_COMMUNITY): Payer: Self-pay | Admitting: *Deleted

## 2014-09-26 ENCOUNTER — Observation Stay (HOSPITAL_COMMUNITY)
Admission: EM | Admit: 2014-09-26 | Discharge: 2014-09-28 | Disposition: A | Discharge status: Home / Self Care | Payer: Medicaid Other | Attending: Internal Medicine | Admitting: Internal Medicine

## 2014-09-26 ENCOUNTER — Encounter (HOSPITAL_COMMUNITY): Payer: Self-pay | Admitting: Emergency Medicine

## 2014-09-26 ENCOUNTER — Emergency Department (HOSPITAL_COMMUNITY): Payer: Medicaid Other

## 2014-09-26 DIAGNOSIS — R079 Chest pain, unspecified: Secondary | ICD-10-CM | POA: Diagnosis present

## 2014-09-26 DIAGNOSIS — R200 Anesthesia of skin: Secondary | ICD-10-CM | POA: Diagnosis not present

## 2014-09-26 DIAGNOSIS — Z720 Tobacco use: Secondary | ICD-10-CM | POA: Diagnosis not present

## 2014-09-26 DIAGNOSIS — I160 Hypertensive urgency: Secondary | ICD-10-CM

## 2014-09-26 DIAGNOSIS — I10 Essential (primary) hypertension: Secondary | ICD-10-CM | POA: Diagnosis not present

## 2014-09-26 DIAGNOSIS — G459 Transient cerebral ischemic attack, unspecified: Principal | ICD-10-CM | POA: Diagnosis present

## 2014-09-26 DIAGNOSIS — F419 Anxiety disorder, unspecified: Secondary | ICD-10-CM | POA: Diagnosis not present

## 2014-09-26 DIAGNOSIS — R51 Headache: Secondary | ICD-10-CM | POA: Diagnosis not present

## 2014-09-26 DIAGNOSIS — D509 Iron deficiency anemia, unspecified: Secondary | ICD-10-CM | POA: Diagnosis present

## 2014-09-26 DIAGNOSIS — R0602 Shortness of breath: Secondary | ICD-10-CM | POA: Insufficient documentation

## 2014-09-26 DIAGNOSIS — I5189 Other ill-defined heart diseases: Secondary | ICD-10-CM | POA: Diagnosis present

## 2014-09-26 DIAGNOSIS — R0789 Other chest pain: Secondary | ICD-10-CM | POA: Diagnosis present

## 2014-09-26 DIAGNOSIS — R55 Syncope and collapse: Secondary | ICD-10-CM | POA: Diagnosis present

## 2014-09-26 DIAGNOSIS — O1490 Unspecified pre-eclampsia, unspecified trimester: Secondary | ICD-10-CM | POA: Insufficient documentation

## 2014-09-26 HISTORY — DX: Hypertensive urgency: I16.0

## 2014-09-26 HISTORY — DX: Anesthesia of skin: R20.0

## 2014-09-26 HISTORY — DX: Unspecified pre-eclampsia, unspecified trimester: O14.90

## 2014-09-26 HISTORY — DX: Essential (primary) hypertension: I10

## 2014-09-26 HISTORY — DX: Iron deficiency anemia, unspecified: D50.9

## 2014-09-26 HISTORY — DX: Other ill-defined heart diseases: I51.89

## 2014-09-26 LAB — COMPREHENSIVE METABOLIC PANEL
ALT: 36 U/L (ref 14–54)
ANION GAP: 7 (ref 5–15)
AST: 40 U/L (ref 15–41)
Albumin: 4 g/dL (ref 3.5–5.0)
Alkaline Phosphatase: 105 U/L (ref 38–126)
BILIRUBIN TOTAL: 0.4 mg/dL (ref 0.3–1.2)
BUN: 8 mg/dL (ref 6–20)
CO2: 25 mmol/L (ref 22–32)
Calcium: 9.2 mg/dL (ref 8.9–10.3)
Chloride: 106 mmol/L (ref 101–111)
Creatinine, Ser: 0.7 mg/dL (ref 0.44–1.00)
GFR calc Af Amer: >60 mL/min (ref >60)
GFR calc non Af Amer: >60 mL/min (ref >60)
Glucose, Bld: 102 mg/dL — ABNORMAL HIGH (ref 70–99)
POTASSIUM: 3.3 mmol/L — AB (ref 3.5–5.1)
SODIUM: 138 mmol/L (ref 135–145)
TOTAL PROTEIN: 8.8 g/dL — AB (ref 6.5–8.1)

## 2014-09-26 LAB — CBC WITH DIFFERENTIAL/PLATELET
BASOS ABS: 0 10*3/uL (ref 0.0–0.1)
BASOS PCT: 0 % (ref 0–1)
EOS PCT: 1 % (ref 0–5)
Eosinophils Absolute: 0.1 10*3/uL (ref 0.0–0.7)
HCT: 28.5 % — ABNORMAL LOW (ref 36.0–46.0)
HEMOGLOBIN: 8.1 g/dL — AB (ref 12.0–15.0)
LYMPHS ABS: 1.5 10*3/uL (ref 0.7–4.0)
LYMPHS PCT: 24 % (ref 12–46)
MCH: 19.7 pg — AB (ref 26.0–34.0)
MCHC: 28.4 g/dL — AB (ref 30.0–36.0)
MCV: 69.3 fL — ABNORMAL LOW (ref 78.0–100.0)
Monocytes Absolute: 0.2 10*3/uL (ref 0.1–1.0)
Monocytes Relative: 3 % (ref 3–12)
NEUTROS ABS: 4.5 10*3/uL (ref 1.7–7.7)
Neutrophils Relative %: 72 % (ref 43–77)
Platelets: 287 10*3/uL (ref 150–400)
RBC: 4.11 MIL/uL (ref 3.87–5.11)
RDW: 16.8 % — ABNORMAL HIGH (ref 11.5–15.5)
WBC: 6.3 10*3/uL (ref 4.0–10.5)

## 2014-09-26 LAB — APTT: aPTT: 25 seconds (ref 24–37)

## 2014-09-26 LAB — TROPONIN I
TROPONIN I: 0.04 ng/mL — AB (ref <0.031)
Troponin I: 0.04 ng/mL — ABNORMAL HIGH (ref <0.031)

## 2014-09-26 LAB — PROTIME-INR
INR: 1.09 (ref 0.00–1.49)
Prothrombin Time: 14.2 seconds (ref 11.6–15.2)

## 2014-09-26 MED ORDER — ASPIRIN EC 81 MG PO TBEC
81.0000 mg | DELAYED_RELEASE_TABLET | Freq: Every day | ORAL | Status: DC
  Administered 2014-09-27 – 2014-09-28 (×2): 81 mg via ORAL
  Filled 2014-09-26 (×2): qty 1

## 2014-09-26 MED ORDER — LISINOPRIL 10 MG PO TABS
40.0000 mg | ORAL_TABLET | Freq: Every day | ORAL | Status: DC
  Administered 2014-09-27 – 2014-09-28 (×2): 40 mg via ORAL
  Filled 2014-09-26 (×2): qty 4

## 2014-09-26 MED ORDER — CLONIDINE HCL 0.2 MG PO TABS
0.2000 mg | ORAL_TABLET | Freq: Once | ORAL | Status: AC
  Administered 2014-09-26: 0.2 mg via ORAL
  Filled 2014-09-26: qty 1

## 2014-09-26 MED ORDER — SODIUM CHLORIDE 0.9 % IJ SOLN
INTRAMUSCULAR | Status: AC
  Filled 2014-09-26: qty 60

## 2014-09-26 MED ORDER — LORAZEPAM 2 MG/ML IJ SOLN
1.0000 mg | Freq: Once | INTRAMUSCULAR | Status: AC
  Administered 2014-09-26: 1 mg via INTRAVENOUS
  Filled 2014-09-26: qty 1

## 2014-09-26 MED ORDER — SODIUM CHLORIDE 0.9 % IJ SOLN
3.0000 mL | Freq: Two times a day (BID) | INTRAMUSCULAR | Status: DC
  Administered 2014-09-27 (×2): 3 mL via INTRAVENOUS

## 2014-09-26 MED ORDER — SODIUM CHLORIDE 0.9 % IJ SOLN: 3.0000 mL | INTRAMUSCULAR | Status: DC | PRN

## 2014-09-26 MED ORDER — HYDROCHLOROTHIAZIDE 25 MG PO TABS
25.0000 mg | ORAL_TABLET | Freq: Every day | ORAL | Status: DC
  Administered 2014-09-27 – 2014-09-28 (×2): 25 mg via ORAL
  Filled 2014-09-26 (×2): qty 1

## 2014-09-26 MED ORDER — SODIUM CHLORIDE 0.9 % IV SOLN: 250.0000 mL | INTRAVENOUS | Status: DC | PRN

## 2014-09-26 MED ORDER — IOHEXOL 350 MG/ML SOLN
80.0000 mL | Freq: Once | INTRAVENOUS | Status: AC | PRN
  Administered 2014-09-26: 80 mL via INTRAVENOUS

## 2014-09-26 MED ORDER — AMLODIPINE BESYLATE 5 MG PO TABS
5.0000 mg | ORAL_TABLET | Freq: Every day | ORAL | Status: DC
  Administered 2014-09-27 – 2014-09-28 (×2): 5 mg via ORAL
  Filled 2014-09-26 (×2): qty 1

## 2014-09-26 MED ORDER — ASPIRIN 81 MG PO CHEW
324.0000 mg | CHEWABLE_TABLET | Freq: Once | ORAL | Status: AC
  Administered 2014-09-26: 324 mg via ORAL
  Filled 2014-09-26: qty 4

## 2014-09-26 MED ORDER — SODIUM CHLORIDE 0.9 % IJ SOLN
3.0000 mL | Freq: Two times a day (BID) | INTRAMUSCULAR | Status: DC
  Administered 2014-09-27 – 2014-09-28 (×3): 3 mL via INTRAVENOUS

## 2014-09-26 NOTE — ED Notes (Signed)
Code stroke called by Dr. Judd Lienelo.  CT scan and lab work doen prior to code stroke being called.

## 2014-09-26 NOTE — ED Notes (Signed)
Code stroke called by Dr.Delo verbally over the phone; paged [facility] sent out at 17:07;

## 2014-09-26 NOTE — ED Notes (Signed)
SOC is called and camera is set up in room; [facility]

## 2014-09-26 NOTE — H&P (Signed)
[facility] History and Physical  [name]:[id number] DOB: [birth year]    PCP:   No primary care provider on file.   Chief Complaint: Hypertensive urgency.   HPI: [name] is an 34 y.o. female with hx of severe HTN, on several medications, noncompliance due to financial constrain, after argument with her 86 y o daughter, reportedly had a syncopal episode.  EMS was summoned, and reportedly found that her BP was in the 300's systolic.  She hadn't taken any of her meds for 2 months.  She did not have any HA, nausea, vomiting, chest pain or SOB.  In the ER, she was given clonidine 0.2mg , and her BP went down to 130.  When I saw her, her BP was 180 systolic, alert and orient.  She did complaint of tingling on her left side, and had some improvement over the ER course.  Evaluation in the ER included a head CT negative for any acute processes.  Her Cr and EKG was unremarkable.  She was also evaluated by neurotelemetry, and was recommended CTA of the head and neck, which were normal. Hospitalist was asked to admit her for hypertensive urgency and possible TIA.   Rewiew of Systems:  Constitutional: Negative for malaise, fever and chills. No significant weight loss or weight gain Eyes: Negative for eye pain, redness and discharge, diplopia, visual changes, or flashes of light. ENMT: Negative for ear pain, hoarseness, nasal congestion, sinus pressure and sore throat. No headaches; tinnitus, drooling, or problem swallowing. Cardiovascular: Negative for chest pain, palpitations, diaphoresis, dyspnea and peripheral edema. ; No orthopnea, PND Respiratory: Negative for cough, hemoptysis, wheezing and stridor. No pleuritic chestpain. Gastrointestinal: Negative for nausea, vomiting, diarrhea, constipation, abdominal pain, melena, blood in stool, hematemesis, jaundice and rectal bleeding.    Genitourinary: Negative for frequency, dysuria, incontinence,flank pain and hematuria; Musculoskeletal:  Negative for back pain and neck pain. Negative for swelling and trauma.;  Skin: . Negative for pruritus, rash, abrasions, bruising and skin lesion.; ulcerations Neuro: Negative for headache, lightheadedness and neck stiffness. Negative for weakness, altered level of consciousness , altered mental status, extremity weakness, burning feet, involuntary movement, seizure and syncope.  Psych: negative for anxiety, depression, insomnia, tearfulness, panic attacks, hallucinations, paranoia, suicidal or homicidal ideation   Past Medical History  Diagnosis Date  . Hypertension   . Preeclampsia   . Preeclampsia   . Preeclampsia   . Preeclampsia     Past Surgical History  Procedure Laterality Date  . C-sections      Medications:  HOME MEDS: Prior to Admission medications   Medication Sig Start Date End Date Taking? Authorizing Provider  amLODipine (NORVASC) 5 MG tablet Take 5 mg by mouth daily.   Yes Historical Provider, MD  hydrochlorothiazide (HYDRODIURIL) 25 MG tablet Take 25 mg by mouth daily.   Yes Historical Provider, MD  lisinopril (PRINIVIL,ZESTRIL) 40 MG tablet Take 40 mg by mouth daily.   Yes Historical Provider, MD     Allergies:  No Known Allergies  Social History:   reports that she has been smoking Cigarettes.  She has been smoking about 1.00 pack per day. She does not have any smokeless tobacco history on file. Her alcohol and drug histories are not on file.  Family History: History reviewed. No pertinent family history.   Physical Exam: Filed Vitals:   09/26/14 2000 09/26/14 2015 09/26/14 2030 09/26/14 2045  BP: 145/78 145/85 142/82 140/90  Pulse: 67 65 65   Temp:  TempSrc:      Resp: 24 23 22 21   Height:      Weight:      SpO2: 96% 97% 92%    Blood pressure 140/90, pulse 65, temperature 98.2 F (36.8 C), temperature source Oral, resp. rate 21, height 5\' 5"  (1.651 m), weight 77.111 kg (170 lb), last menstrual period 09/26/2014, SpO2 92 %, unknown if  currently breastfeeding.  GEN:  Pleasant  patient lying in the stretcher in no acute distress; cooperative with exam. PSYCH:  alert and oriented x4; does not appear anxious or depressed; affect is appropriate. HEENT: Mucous membranes pink and anicteric; PERRLA; EOM intact; no cervical lymphadenopathy nor thyromegaly or carotid bruit; no JVD; There were no stridor. Neck is very supple. Breasts:: Not examined CHEST WALL: No tenderness CHEST: Normal respiration, clear to auscultation bilaterally.  HEART: Regular rate and rhythm.  There are no murmur, rub, or gallops.   BACK: No kyphosis or scoliosis; no CVA tenderness ABDOMEN: soft and non-tender; no masses, no organomegaly, normal abdominal bowel sounds; no pannus; no intertriginous candida. There is no rebound and no distention. Rectal Exam: Not done EXTREMITIES: No bone or joint deformity; age-appropriate arthropathy of the hands and knees; no edema; no ulcerations.  There is no calf tenderness. Genitalia: not examined PULSES: 2+ and symmetric SKIN: Normal hydration no rash or ulceration CNS: Cranial nerves 2-12 grossly intact no focal lateralizing neurologic deficit.  Speech is fluent; uvula elevated with phonation, facial symmetry and tongue midline. DTR are normal bilaterally, cerebella exam is intact, barbinski is negative.  She has slight left sided weakness.  No sensory loss.   Labs on Admission:  Basic Metabolic Panel:  Recent Labs Lab 09/26/14 1433  NA 138  K 3.3*  CL 106  CO2 25  GLUCOSE 102*  BUN 8  CREATININE 0.70  CALCIUM 9.2   Liver Function Tests:  Recent Labs Lab 09/26/14 1433  AST 40  ALT 36  ALKPHOS 105  BILITOT 0.4  PROT 8.8*  ALBUMIN 4.0   No results for input(s): LIPASE, AMYLASE in the last 168 hours. No results for input(s): AMMONIA in the last 168 hours. CBC:  Recent Labs Lab 09/26/14 1433  WBC 6.3  NEUTROABS 4.5  HGB 8.1*  HCT 28.5*  MCV 69.3*  PLT 287   Cardiac Enzymes:  Recent  Labs Lab 09/26/14 1433 09/26/14 1813  TROPONINI 0.04* 0.04*    CBG: No results for input(s): GLUCAP in the last 168 hours.   Radiological Exams on Admission: Ct Angio Head W/cm &/or Wo Cm  09/26/2014   EXAM: CT ANGIOGRAPHY HEAD AND NECK  TECHNIQUE: Multidetector CT imaging of the head and neck was performed using the standard protocol during bolus administration of intravenous contrast. Multiplanar CT image reconstructions and MIPs were obtained to evaluate the vascular anatomy. Carotid stenosis measurements (when applicable) are obtained utilizing NASCET criteria, using the distal internal carotid diameter as the denominator.  CONTRAST:  80mL OMNIPAQUE IOHEXOL 350 MG/ML SOLN  COMPARISON:  None.  FINDINGS: CTA NECK  Aortic arch: Visualized aortic arch is of normal caliber with normal 3 vessel morphology. No high-grade stenosis seen at the origin of the great vessels. Visualized subclavian arteries are patent bilaterally.  Right carotid system: Right common carotid artery is well opacified from its origin to the right carotid bifurcation. Right internal carotid artery is widely patent from the carotid bifurcation to the skullbase. No evidence for dissection, flow-limiting stenosis, or occlusion.  Left carotid system: Left common carotid arteries well  opacified from its origin to the carotid bifurcation. Left internal carotid artery is widely patent from the carotid bifurcation to the skullbase. No evidence for dissection, flow-limiting stenosis, or occlusion.  Vertebral arteries:Both vertebral arteries arise from the subclavian arteries. Vertebral arteries are widely patent to the skullbase without evidence for dissection or flow-limiting stenosis.  Skeleton: No acute osseous abnormality. No worrisome lytic or blastic osseous lesion.  Other neck: Incidental note made of a 7 mm hypodense nodule within the right lobe of thyroid, of doubtful clinical significance. No acute soft tissue abnormality within the  neck. No adenopathy. Visualized lungs are clear.  CTA HEAD  Anterior circulation: The petrous, cavernous, and supra clinoid segments of the internal carotid arteries are widely patent. A1 segments, anterior communicating artery, and anterior cerebral arteries are well opacified and normal in appearance. M1 segments widely patent bilaterally without occlusion or stenosis. MCA bifurcations within normal limits. Distal MCA branches well opacified bilaterally and symmetric in appearance.  Posterior circulation: The vertebral arteries are widely patent to the vertebrobasilar junction. Posterior inferior cerebellar arteries are normal. Basilar artery widely patent. Anterior inferior cerebellar arteries patent bilaterally. Superior cerebellar arteries well opacified. Posterior cerebral arteries widely patent bilaterally. Small posterior communicating arteries present bilaterally.  Venous sinuses: No abnormality within the venous sinuses.  Anatomic variants: No anatomic variant. No aneurysm. Tiny focal outpouching extending from the posterior aspect of the supraclinoid left ICA favored to reflect a normal infundibulum related to the left posterior communicating artery.  Delayed phase: No abnormal enhancement on delayed sequence.  IMPRESSION: Normal CTA of the head and neck.  No acute abnormality identified.   Electronically Signed   By: Rise Mu M.D.   On: 09/26/2014 20:29   Ct Head Wo Contrast  09/26/2014   CLINICAL DATA:  Headache for 1 week. Chest pain and left-sided weakness started today.  EXAM: CT HEAD WITHOUT CONTRAST  TECHNIQUE: Contiguous axial images were obtained from the base of the skull through the vertex without intravenous contrast.  COMPARISON:  None.  FINDINGS: Patient motion artifact. There is no intra or extra-axial fluid collection or mass lesion. The basilar cisterns and ventricles have a normal appearance. There is no CT evidence for acute infarction or hemorrhage. Bone windows are  unremarkable.  IMPRESSION: Negative exam.   Electronically Signed   By: Norva Pavlov M.D.   On: 09/26/2014 16:07   Ct Angio Neck W/cm &/or Wo/cm  09/26/2014   EXAM: CT ANGIOGRAPHY HEAD AND NECK  TECHNIQUE: Multidetector CT imaging of the head and neck was performed using the standard protocol during bolus administration of intravenous contrast. Multiplanar CT image reconstructions and MIPs were obtained to evaluate the vascular anatomy. Carotid stenosis measurements (when applicable) are obtained utilizing NASCET criteria, using the distal internal carotid diameter as the denominator.  CONTRAST:  80mL OMNIPAQUE IOHEXOL 350 MG/ML SOLN  COMPARISON:  None.  FINDINGS: CTA NECK  Aortic arch: Visualized aortic arch is of normal caliber with normal 3 vessel morphology. No high-grade stenosis seen at the origin of the great vessels. Visualized subclavian arteries are patent bilaterally.  Right carotid system: Right common carotid artery is well opacified from its origin to the right carotid bifurcation. Right internal carotid artery is widely patent from the carotid bifurcation to the skullbase. No evidence for dissection, flow-limiting stenosis, or occlusion.  Left carotid system: Left common carotid arteries well opacified from its origin to the carotid bifurcation. Left internal carotid artery is widely patent from the carotid bifurcation to the skullbase.  No evidence for dissection, flow-limiting stenosis, or occlusion.  Vertebral arteries:Both vertebral arteries arise from the subclavian arteries. Vertebral arteries are widely patent to the skullbase without evidence for dissection or flow-limiting stenosis.  Skeleton: No acute osseous abnormality. No worrisome lytic or blastic osseous lesion.  Other neck: Incidental note made of a 7 mm hypodense nodule within the right lobe of thyroid, of doubtful clinical significance. No acute soft tissue abnormality within the neck. No adenopathy. Visualized lungs are clear.   CTA HEAD  Anterior circulation: The petrous, cavernous, and supra clinoid segments of the internal carotid arteries are widely patent. A1 segments, anterior communicating artery, and anterior cerebral arteries are well opacified and normal in appearance. M1 segments widely patent bilaterally without occlusion or stenosis. MCA bifurcations within normal limits. Distal MCA branches well opacified bilaterally and symmetric in appearance.  Posterior circulation: The vertebral arteries are widely patent to the vertebrobasilar junction. Posterior inferior cerebellar arteries are normal. Basilar artery widely patent. Anterior inferior cerebellar arteries patent bilaterally. Superior cerebellar arteries well opacified. Posterior cerebral arteries widely patent bilaterally. Small posterior communicating arteries present bilaterally.  Venous sinuses: No abnormality within the venous sinuses.  Anatomic variants: No anatomic variant. No aneurysm. Tiny focal outpouching extending from the posterior aspect of the supraclinoid left ICA favored to reflect a normal infundibulum related to the left posterior communicating artery.  Delayed phase: No abnormal enhancement on delayed sequence.  IMPRESSION: Normal CTA of the head and neck.  No acute abnormality identified.   Electronically Signed   By: Rise Mu M.D.   On: 09/26/2014 20:29    EKG: Independently reviewed.    Assessment/Plan Present on Admission:  . Hypertensive urgency . TIA (transient ischemic attack) . Tobacco abuse . HTN (hypertension), malignant   PLAN:  Will admit her to telemetry for HTN urgency.   Will keep her BP at 160-180, and resumed her home HTN meds.  Will start her on an ASA 81mg  per day.  Obtain MRI of the brain, though I don't think she had any stroke.  She was recommended to quit cigarettes, and be compliant with her meds.  Will admit her to telemetry under OBS to [facility].     Other plans as per orders.  Code Status: FULL Unk Lightning, MD. [facility] Pager [phone] 7pm to 7am.  09/26/2014, 9:26 PM

## 2014-09-26 NOTE — ED Provider Notes (Signed)
CSN: [id number]     Arrival date & time 09/26/14  1404 History   First MD Initiated Contact with Patient 09/26/14 1506     Chief Complaint  Patient presents with  . Chest Pain  . Panic Attack  . Hypertension     (Consider location/radiation/quality/duration/timing/severity/associated sxs/prior Treatment) HPI Comments: Patient is a 34 year old female with history of hypertension. She is brought by EMS for evaluation of an apparent panic attack. The patient has been having difficulties with her teenage daughter and apparently a verbal argument ensued this afternoon. She became extremely anxious and hyperventilated and EMS was called and she was unable to get up off the floor. When EMS arrived she was markedly hypertensive with blood pressure reported greater than 300 systolic. She complains of tightness in her chest, anxiety, headache.  Patient recently relocated here from [place]. She is out of her leg pressure medications and has not taken them in the past month. She reports being on 4 separate antihypertensives.  Patient is a 34 y.o. female presenting with hypertension. The history is provided by the patient.  Hypertension This is a chronic problem. The problem occurs constantly. The problem has been rapidly worsening. Associated symptoms include chest pain, headaches and shortness of breath. The symptoms are aggravated by stress. Nothing relieves the symptoms. She has tried nothing for the symptoms. The treatment provided no relief.    Past Medical History  Diagnosis Date  . Hypertension   . Preeclampsia   . Preeclampsia   . Preeclampsia   . Preeclampsia    Past Surgical History  Procedure Laterality Date  . C-sections     History reviewed. No pertinent family history. History  Substance Use Topics  . Smoking status: Current Every Day Smoker -- 1.00 packs/day    Types: Cigarettes  . Smokeless tobacco: Not on file  . Alcohol Use: Not on file   OB History    Gravida Para  Term Preterm AB TAB SAB Ectopic Multiple Living   Review of Systems  Respiratory: Positive for shortness of breath.   Cardiovascular: Positive for chest pain.  Neurological: Positive for headaches.  All other systems reviewed and are negative.     Allergies  Review of patient's allergies indicates no known allergies.  Home Medications   Prior to Admission medications   Not on File   BP 212/131 mmHg  Pulse 92  Temp(Src) 98.2 F (36.8 C) (Oral)  Resp 19  Ht  (1.651 m)  Wt 170 lb (77.111 kg)  BMI 28.29 kg/m2  SpO2 98%  LMP 09/26/2014 Physical Exam  Constitutional: She is oriented to person, place, and time. She appears well-developed and well-nourished. No distress.  She appears anxious and tearful.  HENT:  Head: Normocephalic and atraumatic.  Mouth/Throat: Oropharynx is clear and moist.  Neck: Normal range of motion. Neck supple.  Cardiovascular: Normal rate and regular rhythm.  Exam reveals no gallop and no friction rub.   No murmur heard. Pulmonary/Chest: Effort normal and breath sounds normal. No respiratory distress. She has no wheezes.  Abdominal: Soft. Bowel sounds are normal. She exhibits no distension. There is no tenderness.  Musculoskeletal: Normal range of motion. She exhibits no edema.  Lymphadenopathy:    She has no cervical adenopathy.  Neurological: She is alert and oriented to person, place, and time. No cranial nerve deficit. She exhibits normal muscle tone. Coordination normal.  Skin: Skin is warm and dry.  She is not diaphoretic.  Nursing note and vitals reviewed.   ED Course  Procedures (including critical care time) Labs Review Labs Reviewed  CBC WITH DIFFERENTIAL/PLATELET - Abnormal; Notable for the following:    Hemoglobin 8.1 (*)    HCT 28.5 (*)    MCV 69.3 (*)    MCH 19.7 (*)    MCHC 28.4 (*)    RDW 16.8 (*)    All other components within normal limits  COMPREHENSIVE METABOLIC PANEL - Abnormal; Notable for the  following:    Potassium 3.3 (*)    Glucose, Bld 102 (*)    Total Protein 8.8 (*)    All other components within normal limits  TROPONIN I - Abnormal; Notable for the following:    Troponin I 0.04 (*)    All other components within normal limits    Imaging Review No results found.   EKG Interpretation   Date/Time:  Sunday Sep 26 2014 14:04:59 EDT Ventricular Rate:  105 PR Interval:  139 QRS Duration: 85 QT Interval:  350 QTC Calculation: 463 R Axis:   34 Text Interpretation:  Sinus tachycardia Probable left atrial enlargement  Borderline repolarization abnormality No old tracing to compare Confirmed  by [facility]  MD, ELLIOTT [phone]([id number]) on 09/26/2014 2:09:32 PM      MDM   Final diagnoses:  None    This patient is a 34 year old female with history of hypertension. She has been off of her antihypertensive medications for the past 2 months as she is new to the area and has no primary doctor. She apparently had some form of disagreement with her teenage daughter which resulted in her hyperventilating and collapsing on the floor. She was unable to get up and EMS was notified. Her blood pressure was initially reported in excess of 300 systolic. When she arrives here remained markedly elevated, however not this high degree.  Workup was initiated including laboratory studies and head CT. The head CT was obtained as the patient was complaining of a severe headache. All of these were essentially unremarkable. She was given clonidine for her blood pressure and Ativan for her anxiety and her blood pressure significantly improved.  When I returned to the room to discuss the results of these tests with the patient, I was then informed by the patient and the family that she was having weakness and numbness in her left arm and leg. I repeated her neurologic exam and she was noted to have somewhat inconsistent weakness in the left hand and left leg. For this reason, a code stroke was then initiated. The  patient was evaluated by the neurologist who recommended further studies with CT angiography the head and neck. These were obtained which revealed no evidence for obstruction or blockage.  The recommendations of the neurologist were for admission to the hospitalist service where the patient can have her blood pressures managed and potentially undergo further workup to rule out CVA. Dr. Conley RollsLe agrees to admit.  CRITICAL CARE Performed by: Geoffery LyonseLo, [place] Total critical care time: 75 minutes Critical care time was exclusive of separately billable procedures and treating other patients. Critical care was necessary to treat or prevent imminent or life-threatening deterioration. Critical care was time spent personally by me on the following activities: development of treatment plan with patient and/or surrogate as well as nursing, discussions with consultants, evaluation of patient's response to treatment, examination of patient, obtaining history from patient or surrogate, ordering and performing treatments and interventions, ordering and review of laboratory studies,  ordering and review of radiographic studies, pulse oximetry and re-evaluation of patient's condition.     Geoffery Lyons, MD 09/26/14 [phone]

## 2014-09-26 NOTE — ED Notes (Signed)
EMS called out to house for Panic attack. When they arrived, stated pt was lying in floor c/o chest pain with numbness and tingling down L. Arm. Pt stated she was having anxiety attack because of issues with her daughter. Per EMS, pt's SBP >300 and DPB was 160.

## 2014-09-27 ENCOUNTER — Observation Stay (HOSPITAL_COMMUNITY): Payer: Medicaid Other

## 2014-09-27 DIAGNOSIS — D509 Iron deficiency anemia, unspecified: Secondary | ICD-10-CM

## 2014-09-27 DIAGNOSIS — R200 Anesthesia of skin: Secondary | ICD-10-CM

## 2014-09-27 DIAGNOSIS — R0789 Other chest pain: Secondary | ICD-10-CM

## 2014-09-27 DIAGNOSIS — R55 Syncope and collapse: Secondary | ICD-10-CM | POA: Diagnosis present

## 2014-09-27 HISTORY — DX: Iron deficiency anemia, unspecified: D50.9

## 2014-09-27 HISTORY — DX: Anesthesia of skin: R20.0

## 2014-09-27 LAB — BASIC METABOLIC PANEL
Anion gap: 8 (ref 5–15)
BUN: 14 mg/dL (ref 6–20)
CALCIUM: 8.9 mg/dL (ref 8.9–10.3)
CHLORIDE: 105 mmol/L (ref 101–111)
CO2: 27 mmol/L (ref 22–32)
CREATININE: 0.72 mg/dL (ref 0.44–1.00)
GFR calc Af Amer: >60 mL/min (ref >60)
GFR calc non Af Amer: >60 mL/min (ref >60)
GLUCOSE: 93 mg/dL (ref 70–99)
Potassium: 3.5 mmol/L (ref 3.5–5.1)
Sodium: 140 mmol/L (ref 135–145)

## 2014-09-27 LAB — CBC
HCT: 26.6 % — ABNORMAL LOW (ref 36.0–46.0)
Hemoglobin: 7.5 g/dL — ABNORMAL LOW (ref 12.0–15.0)
MCH: 19.7 pg — AB (ref 26.0–34.0)
MCHC: 28.2 g/dL — ABNORMAL LOW (ref 30.0–36.0)
MCV: 70 fL — AB (ref 78.0–100.0)
Platelets: 283 10*3/uL (ref 150–400)
RBC: 3.8 MIL/uL — AB (ref 3.87–5.11)
RDW: 17 % — AB (ref 11.5–15.5)
WBC: 4.6 10*3/uL (ref 4.0–10.5)

## 2014-09-27 LAB — IRON AND TIBC
Iron: 17 ug/dL — ABNORMAL LOW (ref 28–170)
Saturation Ratios: 3 % — ABNORMAL LOW (ref 10.4–31.8)
TIBC: 540 ug/dL — ABNORMAL HIGH (ref 250–450)
UIBC: 523 ug/dL

## 2014-09-27 LAB — FERRITIN: FERRITIN: 3 ng/mL — AB (ref 11–307)

## 2014-09-27 LAB — TSH: TSH: 2.884 u[IU]/mL (ref 0.350–4.500)

## 2014-09-27 LAB — VITAMIN B12: Vitamin B-12: 329 pg/mL (ref 180–914)

## 2014-09-27 MED ORDER — HYDRALAZINE HCL 20 MG/ML IJ SOLN: 5.0000 mg | INTRAMUSCULAR | Status: DC | PRN

## 2014-09-27 MED ORDER — LORAZEPAM 2 MG/ML IJ SOLN
0.5000 mg | INTRAMUSCULAR | Status: DC | PRN
  Administered 2014-09-27 – 2014-09-28 (×2): 0.5 mg via INTRAVENOUS
  Filled 2014-09-27 (×2): qty 1

## 2014-09-27 MED ORDER — ZOLPIDEM TARTRATE 5 MG PO TABS
5.0000 mg | ORAL_TABLET | Freq: Every evening | ORAL | Status: DC | PRN
  Administered 2014-09-27: 5 mg via ORAL
  Filled 2014-09-27: qty 1

## 2014-09-27 MED ORDER — POTASSIUM CHLORIDE CRYS ER 20 MEQ PO TBCR
30.0000 meq | EXTENDED_RELEASE_TABLET | Freq: Two times a day (BID) | ORAL | Status: AC
  Administered 2014-09-27 (×2): 30 meq via ORAL
  Filled 2014-09-27 (×4): qty 1

## 2014-09-27 MED ORDER — HYDROCODONE-ACETAMINOPHEN 5-325 MG PO TABS
1.0000 | ORAL_TABLET | ORAL | Status: DC | PRN
  Administered 2014-09-27: 1 via ORAL
  Administered 2014-09-28 (×2): 2 via ORAL
  Filled 2014-09-27: qty 1
  Filled 2014-09-27 (×2): qty 2

## 2014-09-27 NOTE — Care Management Note (Signed)
Case Management Note  Patient Details  Name: Inda MerlinMonica Y Caine MRN: [id number] Date of Birth: 08/09/1980  Subjective/Objective:                  Pt admitted from home with HTN urgency. Pt lives with her husband and will return home at discharge. Pt is independent with ADL's.  Action/Plan: Pt stated that she has not had her Medicaid moved to [place] it is still active in [place]. Financial counselor is aware and will assist pt as needed. Will need to establish pt with PCP once status of Medicaid has been determined.   Expected Discharge Date:  09/28/14               Expected Discharge Plan:  Home/Self Care  In-House Referral:  Financial Counselor  Discharge planning Services  CM Consult  Post Acute Care Choice:    Choice offered to:     DME Arranged:    DME Agency:     HH Arranged:    HH Agency:     Status of Service:     Medicare Important Message Given:    Date Medicare IM Given:    Medicare IM give by:    Date Additional Medicare IM Given:    Additional Medicare Important Message give by:     If discussed at Long Length of Stay Meetings, dates discussed:    Additional Comments:  Cheryl FlashBlackwell, Jewelia Bocchino Crowder, RN 09/27/2014, 1:14 PM

## 2014-09-27 NOTE — Progress Notes (Addendum)
[facility] PROGRESS NOTE  [name] NWG:[id number] DOB: 1981-05-19 DOA: 09/26/2014 PCP: No primary care provider on file.    Code Status: Full code Family Communication: Discussed with patient's mother and sister. Disposition Plan: Discharge when clinically appropriate.   Consultants:  None  Procedures:  Echocardiogram, pending  Antibiotics:  None  HPI/Subjective: The patient has less chest pressure. Some of the sensation of feeling has come back in her left leg, but she still feels numbness on the left side of her face and left arm. She has no complaints of headache, dizziness, difficulty speaking, or difficulty swallowing.  Objective: Filed Vitals:   09/27/14 0458  BP: 147/93  Pulse: 65  Temp: 97.6 F (36.4 C)  Resp: 20   oxygen saturation 100% on room air. No intake or output data in the 24 hours ending 09/27/14 1101 Filed Weights   09/26/14 1407 09/26/14 2303  Weight: 77.111 kg (170 lb) 77.111 kg (170 lb)    Exam:   General:  34 year old African-American woman in no acute distress.  Cardiovascular: S1, S2, with a soft systolic murmur.  Respiratory: Clear to auscultation bilaterally.  Abdomen: Positive bowel sounds, soft, nontender, nondistended.  Musculoskeletal/extremities: No pedal edema.  Neurologic: The patient is alert and oriented 3. Cranial nerves II through XII appear to be grossly intact with exception of decreased sensation of the left side of her face. She has decreased sensation to sharp and soft touch on the left upper and left lower extremities. She has a mild left pronator drift. Strength is 4 minus over 5 of the left upper and left lower extremity and 5 over 5 of the right upper and right lower extremity. No evidence of dysarthria or dysmetria or dysphasia or aphasia.   Data Reviewed: Basic Metabolic Panel:  Recent Labs Lab 09/26/14 1433  NA 138  K 3.3*  CL 106  CO2 25  GLUCOSE 102*  BUN 8  CREATININE 0.70  CALCIUM  9.2   Liver Function Tests:  Recent Labs Lab 09/26/14 1433  AST 40  ALT 36  ALKPHOS 105  BILITOT 0.4  PROT 8.8*  ALBUMIN 4.0   No results for input(s): LIPASE, AMYLASE in the last 168 hours. No results for input(s): AMMONIA in the last 168 hours. CBC:  Recent Labs Lab 09/26/14 1433  WBC 6.3  NEUTROABS 4.5  HGB 8.1*  HCT 28.5*  MCV 69.3*  PLT 287   Cardiac Enzymes:  Recent Labs Lab 09/26/14 1433 09/26/14 1813  TROPONINI 0.04* 0.04*   BNP (last 3 results) No results for input(s): BNP in the last 8760 hours.  ProBNP (last 3 results) No results for input(s): PROBNP in the last 8760 hours.  CBG: No results for input(s): GLUCAP in the last 168 hours.  No results found for this or any previous visit (from the past 240 hour(s)).   Studies: Ct Angio Head W/cm &/or Wo Cm  09/26/2014   EXAM: CT ANGIOGRAPHY HEAD AND NECK  TECHNIQUE: Multidetector CT imaging of the head and neck was performed using the standard protocol during bolus administration of intravenous contrast. Multiplanar CT image reconstructions and MIPs were obtained to evaluate the vascular anatomy. Carotid stenosis measurements (when applicable) are obtained utilizing NASCET criteria, using the distal internal carotid diameter as the denominator.  CONTRAST:  80mL OMNIPAQUE IOHEXOL 350 MG/ML SOLN  COMPARISON:  None.  FINDINGS: CTA NECK  Aortic arch: Visualized aortic arch is of normal caliber with normal 3 vessel morphology. No high-grade stenosis seen at the origin  of the great vessels. Visualized subclavian arteries are patent bilaterally.  Right carotid system: Right common carotid artery is well opacified from its origin to the right carotid bifurcation. Right internal carotid artery is widely patent from the carotid bifurcation to the skullbase. No evidence for dissection, flow-limiting stenosis, or occlusion.  Left carotid system: Left common carotid arteries well opacified from its origin to the carotid  bifurcation. Left internal carotid artery is widely patent from the carotid bifurcation to the skullbase. No evidence for dissection, flow-limiting stenosis, or occlusion.  Vertebral arteries:Both vertebral arteries arise from the subclavian arteries. Vertebral arteries are widely patent to the skullbase without evidence for dissection or flow-limiting stenosis.  Skeleton: No acute osseous abnormality. No worrisome lytic or blastic osseous lesion.  Other neck: Incidental note made of a 7 mm hypodense nodule within the right lobe of thyroid, of doubtful clinical significance. No acute soft tissue abnormality within the neck. No adenopathy. Visualized lungs are clear.  CTA HEAD  Anterior circulation: The petrous, cavernous, and supra clinoid segments of the internal carotid arteries are widely patent. A1 segments, anterior communicating artery, and anterior cerebral arteries are well opacified and normal in appearance. M1 segments widely patent bilaterally without occlusion or stenosis. MCA bifurcations within normal limits. Distal MCA branches well opacified bilaterally and symmetric in appearance.  Posterior circulation: The vertebral arteries are widely patent to the vertebrobasilar junction. Posterior inferior cerebellar arteries are normal. Basilar artery widely patent. Anterior inferior cerebellar arteries patent bilaterally. Superior cerebellar arteries well opacified. Posterior cerebral arteries widely patent bilaterally. Small posterior communicating arteries present bilaterally.  Venous sinuses: No abnormality within the venous sinuses.  Anatomic variants: No anatomic variant. No aneurysm. Tiny focal outpouching extending from the posterior aspect of the supraclinoid left ICA favored to reflect a normal infundibulum related to the left posterior communicating artery.  Delayed phase: No abnormal enhancement on delayed sequence.  IMPRESSION: Normal CTA of the head and neck.  No acute abnormality identified.    Electronically Signed   By: Rise Mu M.D.   On: 09/26/2014 20:29   Ct Head Wo Contrast  09/26/2014   CLINICAL DATA:  Headache for 1 week. Chest pain and left-sided weakness started today.  EXAM: CT HEAD WITHOUT CONTRAST  TECHNIQUE: Contiguous axial images were obtained from the base of the skull through the vertex without intravenous contrast.  COMPARISON:  None.  FINDINGS: Patient motion artifact. There is no intra or extra-axial fluid collection or mass lesion. The basilar cisterns and ventricles have a normal appearance. There is no CT evidence for acute infarction or hemorrhage. Bone windows are unremarkable.  IMPRESSION: Negative exam.   Electronically Signed   By: Norva Pavlov M.D.   On: 09/26/2014 16:07   Ct Angio Neck W/cm &/or Wo/cm  09/26/2014   EXAM: CT ANGIOGRAPHY HEAD AND NECK  TECHNIQUE: Multidetector CT imaging of the head and neck was performed using the standard protocol during bolus administration of intravenous contrast. Multiplanar CT image reconstructions and MIPs were obtained to evaluate the vascular anatomy. Carotid stenosis measurements (when applicable) are obtained utilizing NASCET criteria, using the distal internal carotid diameter as the denominator.  CONTRAST:  80mL OMNIPAQUE IOHEXOL 350 MG/ML SOLN  COMPARISON:  None.  FINDINGS: CTA NECK  Aortic arch: Visualized aortic arch is of normal caliber with normal 3 vessel morphology. No high-grade stenosis seen at the origin of the great vessels. Visualized subclavian arteries are patent bilaterally.  Right carotid system: Right common carotid artery is well opacified from  its origin to the right carotid bifurcation. Right internal carotid artery is widely patent from the carotid bifurcation to the skullbase. No evidence for dissection, flow-limiting stenosis, or occlusion.  Left carotid system: Left common carotid arteries well opacified from its origin to the carotid bifurcation. Left internal carotid artery is widely  patent from the carotid bifurcation to the skullbase. No evidence for dissection, flow-limiting stenosis, or occlusion.  Vertebral arteries:Both vertebral arteries arise from the subclavian arteries. Vertebral arteries are widely patent to the skullbase without evidence for dissection or flow-limiting stenosis.  Skeleton: No acute osseous abnormality. No worrisome lytic or blastic osseous lesion.  Other neck: Incidental note made of a 7 mm hypodense nodule within the right lobe of thyroid, of doubtful clinical significance. No acute soft tissue abnormality within the neck. No adenopathy. Visualized lungs are clear.  CTA HEAD  Anterior circulation: The petrous, cavernous, and supra clinoid segments of the internal carotid arteries are widely patent. A1 segments, anterior communicating artery, and anterior cerebral arteries are well opacified and normal in appearance. M1 segments widely patent bilaterally without occlusion or stenosis. MCA bifurcations within normal limits. Distal MCA branches well opacified bilaterally and symmetric in appearance.  Posterior circulation: The vertebral arteries are widely patent to the vertebrobasilar junction. Posterior inferior cerebellar arteries are normal. Basilar artery widely patent. Anterior inferior cerebellar arteries patent bilaterally. Superior cerebellar arteries well opacified. Posterior cerebral arteries widely patent bilaterally. Small posterior communicating arteries present bilaterally.  Venous sinuses: No abnormality within the venous sinuses.  Anatomic variants: No anatomic variant. No aneurysm. Tiny focal outpouching extending from the posterior aspect of the supraclinoid left ICA favored to reflect a normal infundibulum related to the left posterior communicating artery.  Delayed phase: No abnormal enhancement on delayed sequence.  IMPRESSION: Normal CTA of the head and neck.  No acute abnormality identified.   Electronically Signed   By: Rise Mu  M.D.   On: 09/26/2014 20:29    Scheduled Meds: . amLODipine  5 mg Oral Daily  . aspirin EC  81 mg Oral Daily  . hydrochlorothiazide  25 mg Oral Daily  . lisinopril  40 mg Oral Daily  . potassium chloride  30 mEq Oral BID  . sodium chloride  3 mL Intravenous Q12H  . sodium chloride  3 mL Intravenous Q12H   Continuous Infusions:   Assessment and plan: Principal Problem:   Hypertensive urgency Active Problems:   TIA (transient ischemic attack)   HTN (hypertension), malignant   Left sided numbness   Chest pressure   Syncope   Tobacco abuse   1. Hypertensive urgency in the setting of malignant hypertension. On admission, the patient's systolic blood pressure ranged from 194-231 and her diastolic blood pressure ranged from 131-150. She was given oral clonidine with some improvement. Per history, she had not taken her chronic blood pressure medications including Norvasc, hydrochlorothiazide, lisinopril and several months since moving back to [place]. These medications were restarted on admission. When necessary hydralazine has been added. Will also add clonidine at 0.1 mg twice a day, but with caution as to not drop her blood pressure too abruptly.  Left-sided weakness and numbness. CT angiogram of the head and neck were ordered in the ED and were essentially negative. Clinically, she has symptomatology of a TIA, but acute stroke will need to be ruled out. The numbness and tingling could also be secondary to temporary neurological manifestations of hypertensive urgency. Her symptoms could also be from anemia. We will assess for vitamin B12  deficiency and hypothyroidism/hyperthyroidism. MRI of the brain has been ordered and is pending (patient is very claustrophobic and will be given lorazepam). Will order carotid ultrasound and 2-D echocardiogram for further evaluation. Will order PT evaluation.  Reported brief syncopal episode at home. Evaluation diagnostic testing as  above.  Chest pressure. Her EKG on admission revealed mild sinus tachycardia with T-wave inversions in the lateral leads and LVH. Her troponin I was 0.04, likely not a manifestation of acute coronary syndrome, but because of demand ischemia from malignant hypertension. We'll continue to treat her supportively and try to manage her malignant hypertension. 2-D echocardiogram ordered and is pending.  Tobacco abuse.  The patient was advised to stop smoking.  Microcytic anemia. For evaluation, vitamin B12, TSH, total iron, and ferritin were ordered; currently pending.    Time spent: 35 minutes.    [facility] University Behavioral Medicine CenterFISHER,Ziyon Cedotal  [facility] Pager [phone]. If 7PM-7AM, please contact night-coverage at [email or web address], password [email or web address] 09/27/2014, 11:01 AM

## 2014-09-27 NOTE — Progress Notes (Signed)
Notified by radiology that MRI was unable to be performed. Pt experienced panic attack and was claustrophobic. MD notified and made aware.

## 2014-09-27 NOTE — Progress Notes (Signed)
PT Cancellation Note  Patient Details Name: [name] MRN: [id number] DOB: 07/05/1980   Cancelled Treatment:    Reason Eval/Treat Not Completed: Medical issues which prohibited therapy.  Per RN, BP is quite elevated.  They are preparing pt for another try at an MRI by giving her some Ativan, IV.  Will defer eval until tomorrow.   [name], [name] 09/27/2014, 2:25 PM

## 2014-09-28 ENCOUNTER — Observation Stay (HOSPITAL_COMMUNITY): Payer: Medicaid Other

## 2014-09-28 DIAGNOSIS — R55 Syncope and collapse: Secondary | ICD-10-CM | POA: Diagnosis not present

## 2014-09-28 DIAGNOSIS — I5189 Other ill-defined heart diseases: Secondary | ICD-10-CM

## 2014-09-28 HISTORY — DX: Other ill-defined heart diseases: I51.89

## 2014-09-28 LAB — CBC
HCT: 29.1 % — ABNORMAL LOW (ref 36.0–46.0)
HEMATOCRIT: 26.1 % — AB (ref 36.0–46.0)
HEMOGLOBIN: 7.5 g/dL — AB (ref 12.0–15.0)
Hemoglobin: 8.7 g/dL — ABNORMAL LOW (ref 12.0–15.0)
MCH: 20.1 pg — ABNORMAL LOW (ref 26.0–34.0)
MCH: 21.6 pg — ABNORMAL LOW (ref 26.0–34.0)
MCHC: 28.7 g/dL — ABNORMAL LOW (ref 30.0–36.0)
MCHC: 29.9 g/dL — ABNORMAL LOW (ref 30.0–36.0)
MCV: 69.8 fL — AB (ref 78.0–100.0)
MCV: 72.4 fL — AB (ref 78.0–100.0)
PLATELETS: 269 10*3/uL (ref 150–400)
Platelets: 289 10*3/uL (ref 150–400)
RBC: 3.74 MIL/uL — ABNORMAL LOW (ref 3.87–5.11)
RBC: 4.02 MIL/uL (ref 3.87–5.11)
RDW: 16.9 % — AB (ref 11.5–15.5)
RDW: 18.8 % — ABNORMAL HIGH (ref 11.5–15.5)
WBC: 4.2 10*3/uL (ref 4.0–10.5)
WBC: 6.5 10*3/uL (ref 4.0–10.5)

## 2014-09-28 LAB — BASIC METABOLIC PANEL
Anion gap: 7 (ref 5–15)
BUN: 14 mg/dL (ref 6–20)
CO2: 29 mmol/L (ref 22–32)
Calcium: 9 mg/dL (ref 8.9–10.3)
Chloride: 103 mmol/L (ref 101–111)
Creatinine, Ser: 0.7 mg/dL (ref 0.44–1.00)
GFR calc Af Amer: >60 mL/min (ref >60)
GFR calc non Af Amer: >60 mL/min (ref >60)
Glucose, Bld: 97 mg/dL (ref 70–99)
POTASSIUM: 3.4 mmol/L — AB (ref 3.5–5.1)
SODIUM: 139 mmol/L (ref 135–145)

## 2014-09-28 LAB — PREPARE RBC (CROSSMATCH)

## 2014-09-28 LAB — ABO/RH: ABO/RH(D): A POS

## 2014-09-28 MED ORDER — HYDROCHLOROTHIAZIDE 25 MG PO TABS: 25.0000 mg | ORAL_TABLET | Freq: Every day | ORAL | Status: DC

## 2014-09-28 MED ORDER — CLONIDINE HCL 0.1 MG PO TABS: 0.1000 mg | ORAL_TABLET | Freq: Two times a day (BID) | ORAL | Status: DC

## 2014-09-28 MED ORDER — CLONIDINE HCL 0.1 MG PO TABS
0.1000 mg | ORAL_TABLET | Freq: Once | ORAL | Status: AC
  Administered 2014-09-28: 0.1 mg via ORAL
  Filled 2014-09-28: qty 1

## 2014-09-28 MED ORDER — SODIUM CHLORIDE 0.9 % IV SOLN
Freq: Once | INTRAVENOUS | Status: AC
  Administered 2014-09-28: 11:00:00 via INTRAVENOUS

## 2014-09-28 MED ORDER — FERROUS SULFATE 325 (65 FE) MG PO TABS
325.0000 mg | ORAL_TABLET | Freq: Three times a day (TID) | ORAL | Status: DC
  Administered 2014-09-28 (×2): 325 mg via ORAL
  Filled 2014-09-28 (×2): qty 1

## 2014-09-28 MED ORDER — FERROUS SULFATE 325 (65 FE) MG PO TABS: 325.0000 mg | ORAL_TABLET | Freq: Three times a day (TID) | ORAL | Status: DC

## 2014-09-28 MED ORDER — HYDRALAZINE HCL 20 MG/ML IJ SOLN
10.0000 mg | Freq: Once | INTRAMUSCULAR | Status: AC
  Administered 2014-09-28: 10 mg via INTRAVENOUS
  Filled 2014-09-28: qty 1

## 2014-09-28 MED ORDER — LISINOPRIL 40 MG PO TABS: 40.0000 mg | ORAL_TABLET | Freq: Every day | ORAL | Status: DC

## 2014-09-28 MED ORDER — HYDROCODONE-ACETAMINOPHEN 5-325 MG PO TABS: 1.0000 | ORAL_TABLET | ORAL | Status: DC | PRN

## 2014-09-28 MED ORDER — POTASSIUM CHLORIDE ER 20 MEQ PO TBCR: 10.0000 meq | EXTENDED_RELEASE_TABLET | Freq: Every day | ORAL | Status: DC

## 2014-09-28 NOTE — Progress Notes (Signed)
PT Cancellation Note  Patient Details Name: Melinda MerlinMonica Y Henry MRN: [id number] DOB: 09/30/1980   Cancelled Treatment:    Reason Eval/Treat Not Completed: Medical issues which prohibited therapy.  I saw pt at about 1:30 this afternoon for evaluation of her mobility.  Her BP was 185/92 so I deferred my visit .  I returned again an hour later and BP is now 188/101.  RN is aware.  I will defer any further PT until tomorrow as long as her BP is under control.   [name], Lawan Nanez L 09/28/2014, 2:27 PM

## 2014-09-28 NOTE — Discharge Summary (Signed)
Physician Discharge Summary  [name] ZOX:[id number] DOB: 09/30/1980 DOA: 09/26/2014  PCP: No primary care provider on file.  Admit date: 09/26/2014 Discharge date: 09/28/2014  Time spent: greater than 30 minutes  Recommendations for Outpatient Follow-up:  1. Recommend follow up of the patient's H/H  2. Patient advised to follow up with Dr. Despina HiddenEure for heavy menstrual periods.  Discharge Diagnoses:    Hypertensive urgency   HTN (hypertension), malignant   Left sided numbness: TIA vs. Hypertensive neuropathy vs. musculoskeletal   Grade 2 diastolic dysfunction and moderate LVH. EF 55-60%   Chest pressure   Syncope   Tobacco abuse   Microcytic anemia; s/p 1 unit prbc transfusion   Discharge Condition: improved  Diet recommendation: heart healthy  Filed Weights   09/26/14 1407 09/26/14 2303  Weight: 77.111 kg (170 lb) 77.111 kg (170 lb)    History of present illness:  [name] is an 34 y.o. female with hx of severe HTN, on several medications, but had been noncompliant due to financial constraints and because she had recently moved back to [place] from [place] and her Medicaid had not been updated. After an argument with her 33 y o daughter, she reportedly had a syncopal episode. EMS was summoned, and reportedly found that her BP was in the 300's systolically. She hadn't taken any of her medications for over 2 months. She did not have any HA, nausea, vomiting, or SOB. She did have chest pressure, left facial and left sided numbness and weakness. She denied difficulty speaking and swallowing. In the ED, she was given clonidine 0.2mg , and her SBP went down to 130. Evaluation in the ED also included a head CT which was negative for any acute processes. Her CXR was unremarkable. She was also evaluated by neurotelemetry who recommended CTA of the head and neck. It was essentially negative. Hospitalist was asked to admit her for hypertensive urgency and possible TIA.   Hospital  Course:   1. Hypertensive urgency in the setting of malignant hypertension. On admission, the patient's systolic blood pressure ranged from 194-231 and her diastolic blood pressure ranged from 131-150. She was given oral clonidine with some improvement. Per history, she had not taken her chronic blood pressure medications including Norvasc, hydrochlorothiazide, lisinopril and several months since moving back to [place]. These medications were restarted on admission. When necessary hydralazine wsa added. Eventually, Clonidine at 0.1 mg twice a day, but with caution as to not drop her blood pressure too abruptly. Her blood pressure improved, but she was still hypertensive. Because of cost, Norvasc was discontinued at discharge and Clonidine was prescribed. She was instructed to continue her other medications. Prescriptions were given which she could get on the [facility]4 list at [facility] until her Medicaid is reinstated in [place].  Left-sided weakness and numbness. CT angiogram of the head and neck were ordered in the ED and were essentially negative. Clinically, it was unclear if her symptomatology was of a TIA, stroke, musculoskeletal or neuropathic. The numbness and tingling could also be secondary to temporary neurological manifestations of hypertensive urgency. A number of studies were ordered. MRI of her brain was negative. MRI of her cervical was normal. Carotid US revealed no ICA stenosis. Echo revealed Grade 2 diastolic dysfunction, moderate LVH, and EF 55-60%. TSH and Vitamin B12 were WNL. Her symptoms subsided but did not completely resolve, so the etiology was more likely neuropathic from accelerated HTN or musculoskeletal as she was tender over the lateral neck and shoulder. Pain medications were  given.  Reported brief syncopal episode at home. Evaluation diagnostic testing as above.   Chest pressure. Her EKG on admission revealed mild sinus tachycardia with T-wave inversions in the lateral  leads and LVH. Her troponin I was 0.04, was likely not a manifestation of acute coronary syndrome, but because of demand ischemia from malignant hypertension. Her malignant hypertension was treated and analgesics were given. Her echo results were dictated above. She had no chest pressure at discharge.  Tobacco abuse.  The patient was advised to stop smoking.  Microcytic anemia. Her hemoglobin was 7.5. She reported easy fatigability. She reported heavy menstrual periods, which was likely the etiology. Her total iron was 17, ferritin was 3, B12 and TSH were WNL. She was transfused 1 unit of packed red blood cells and started on ferrous sulfate. Her hemoglobin was 8.7 following the transfusion. She was advised to follow up with Dr. Despina Hidden for further evaluation.   Procedures:  Echo:Study Conclusions - Left ventricle: The cavity size was normal. Wall thickness was increased in a pattern of moderate LVH. Systolic function was normal. The estimated ejection fraction was in the range of 55% to 60%. Wall motion was normal; there were no regional wall motion abnormalities. Features are consistent with a pseudonormal left ventricular filling pattern, with concomitant abnormal relaxation and increased filling pressure (grade 2 diastolic dysfunction). - Mitral valve: The valve appears to be grossly normal. Small mobile echodensity on chordal structure, likely loose portion of chordal apparatus (would be an unusual location for a vegetation). There was mild regurgitation. - Left atrium: The atrium was mildly dilated. - Right atrium: Central venous pressure (est): 3 mm Hg. - Tricuspid valve: There was trivial regurgitation. - Pulmonary arteries: Systolic pressure could not be accurately estimated. - Pericardium, extracardiac: There was no pericardial effusion. Impressions: - Moderate LVH with LVEF 55-60%, grade 2 diastolic dysfunction. Mild left atrial enlargement. The  mitral valve appears to be grossly normal. Small mobile echodensity on chordal structure, likely loose portion of chordal apparatus (would be an unusual location for a vegetation). Mild mitral regurgtation. Unable to assess PASP.   Consultations:  none  Discharge Exam: Filed Vitals:   09/28/14 1705  BP: 188/99  Pulse: 62  Temp: 98.2 F (36.8 C)  Resp: 19    General: 34 year old African-American woman in no acute distress.  Cardiovascular: S1, S2, with a soft systolic murmur.  Respiratory: Clear to auscultation bilaterally.  Abdomen: Positive bowel sounds, soft, nontender, nondistended.  Musculoskeletal/extremities: No pedal edema. Left lateral neck and left upper arm with moderate tenderness-no edema of warmth.  Neurologic: The patient is alert and oriented 3. Cranial nerves II through XII appear to be grossly intact. She has mild decreased sensation to sharp and soft touch on the left upper and left lower extremities. Strength is 5- over 5 of the left upper and left lower extremity and 5 over 5 of the right upper and right lower extremity. No evidence of dysarthria or dysmetria or dysphasia or aphasia.   Discharge Instructions   Discharge Instructions    Diet - low sodium heart healthy    Complete by:  As directed      Discharge instructions    Complete by:  As directed   Take medications as prescribed. Follow-up at the clinic was scheduled for you. You will need to call and follow-up with Dr. Despina Hidden for evaluation of your heavy menstrual bleeding. Follow a low-salt diet. Stop smoking.     Increase activity slowly    Complete  by:  As directed           Current Discharge Medication List    START taking these medications   Details  cloNIDine (CATAPRES) 0.1 MG tablet Take 1 tablet (0.1 mg total) by mouth 2 (two) times daily. New medication to help treat your high blood pressure Qty: 60 tablet, Refills: 11    ferrous sulfate 325 (65 FE) MG tablet Take 1  tablet (325 mg total) by mouth 3 (three) times daily with meals. Iron supplement Qty: 90 tablet, Refills: 3    HYDROcodone-acetaminophen (NORCO/VICODIN) 5-325 MG per tablet Take 1 tablet by mouth every 4 (four) hours as needed for moderate pain. Qty: 30 tablet, Refills: 0    potassium chloride 20 MEQ TBCR Take 10 mEq by mouth daily. Take with you blood pressure medicine hydrochlorothiazide. Qty: 30 tablet, Refills: 2      CONTINUE these medications which have CHANGED   Details  hydrochlorothiazide (HYDRODIURIL) 25 MG tablet Take 1 tablet (25 mg total) by mouth daily. Qty: 30 tablet, Refills: 2    lisinopril (PRINIVIL,ZESTRIL) 40 MG tablet Take 1 tablet (40 mg total) by mouth daily. Qty: 30 tablet, Refills: 2      STOP taking these medications     amLODipine (NORVASC) 5 MG tablet        No Known Allergies Follow-up Information    Follow up with [facility] On 10/01/2014.   Why:  at 9:30   Contact information:   [address] [place] [address] [phone]      Follow up with Lazaro Arms, MD.   Specialties:  Obstetrics and Gynecology, Radiology   Why:  Call an appointment regarding her menstrual bleeding.   Contact information:   [address] [place] [address] [phone]        The results of significant diagnostics from this hospitalization (including imaging, microbiology, ancillary and laboratory) are listed below for reference.    Significant Diagnostic Studies: Ct Angio Head W/cm &/or Wo Cm  09/26/2014   EXAM: CT ANGIOGRAPHY HEAD AND NECK  TECHNIQUE: Multidetector CT imaging of the head and neck was performed using the standard protocol during bolus administration of intravenous contrast. Multiplanar CT image reconstructions and MIPs were obtained to evaluate the vascular anatomy. Carotid stenosis measurements (when applicable) are obtained utilizing NASCET criteria, using the distal internal  carotid diameter as the denominator.  CONTRAST:  80mL OMNIPAQUE IOHEXOL 350 MG/ML SOLN  COMPARISON:  None.  FINDINGS: CTA NECK  Aortic arch: Visualized aortic arch is of normal caliber with normal 3 vessel morphology. No high-grade stenosis seen at the origin of the great vessels. Visualized subclavian arteries are patent bilaterally.  Right carotid system: Right common carotid artery is well opacified from its origin to the right carotid bifurcation. Right internal carotid artery is widely patent from the carotid bifurcation to the skullbase. No evidence for dissection, flow-limiting stenosis, or occlusion.  Left carotid system: Left common carotid arteries well opacified from its origin to the carotid bifurcation. Left internal carotid artery is widely patent from the carotid bifurcation to the skullbase. No evidence for dissection, flow-limiting stenosis, or occlusion.  Vertebral arteries:Both vertebral arteries arise from the subclavian arteries. Vertebral arteries are widely patent to the skullbase without evidence for dissection or flow-limiting stenosis.  Skeleton: No acute osseous abnormality. No worrisome lytic or blastic osseous lesion.  Other neck: Incidental note made of a 7 mm hypodense nodule within the right lobe of  thyroid, of doubtful clinical significance. No acute soft tissue abnormality within the neck. No adenopathy. Visualized lungs are clear.  CTA HEAD  Anterior circulation: The petrous, cavernous, and supra clinoid segments of the internal carotid arteries are widely patent. A1 segments, anterior communicating artery, and anterior cerebral arteries are well opacified and normal in appearance. M1 segments widely patent bilaterally without occlusion or stenosis. MCA bifurcations within normal limits. Distal MCA branches well opacified bilaterally and symmetric in appearance.  Posterior circulation: The vertebral arteries are widely patent to the vertebrobasilar junction. Posterior inferior  cerebellar arteries are normal. Basilar artery widely patent. Anterior inferior cerebellar arteries patent bilaterally. Superior cerebellar arteries well opacified. Posterior cerebral arteries widely patent bilaterally. Small posterior communicating arteries present bilaterally.  Venous sinuses: No abnormality within the venous sinuses.  Anatomic variants: No anatomic variant. No aneurysm. Tiny focal outpouching extending from the posterior aspect of the supraclinoid left ICA favored to reflect a normal infundibulum related to the left posterior communicating artery.  Delayed phase: No abnormal enhancement on delayed sequence.  IMPRESSION: Normal CTA of the head and neck.  No acute abnormality identified.   Electronically Signed   By: Rise Mu M.D.   On: 09/26/2014 20:29   Ct Head Wo Contrast  09/26/2014   CLINICAL DATA:  Headache for 1 week. Chest pain and left-sided weakness started today.  EXAM: CT HEAD WITHOUT CONTRAST  TECHNIQUE: Contiguous axial images were obtained from the base of the skull through the vertex without intravenous contrast.  COMPARISON:  None.  FINDINGS: Patient motion artifact. There is no intra or extra-axial fluid collection or mass lesion. The basilar cisterns and ventricles have a normal appearance. There is no CT evidence for acute infarction or hemorrhage. Bone windows are unremarkable.  IMPRESSION: Negative exam.   Electronically Signed   By: Norva Pavlov M.D.   On: 09/26/2014 16:07   Ct Angio Neck W/cm &/or Wo/cm  09/26/2014   EXAM: CT ANGIOGRAPHY HEAD AND NECK  TECHNIQUE: Multidetector CT imaging of the head and neck was performed using the standard protocol during bolus administration of intravenous contrast. Multiplanar CT image reconstructions and MIPs were obtained to evaluate the vascular anatomy. Carotid stenosis measurements (when applicable) are obtained utilizing NASCET criteria, using the distal internal carotid diameter as the denominator.  CONTRAST:   80mL OMNIPAQUE IOHEXOL 350 MG/ML SOLN  COMPARISON:  None.  FINDINGS: CTA NECK  Aortic arch: Visualized aortic arch is of normal caliber with normal 3 vessel morphology. No high-grade stenosis seen at the origin of the great vessels. Visualized subclavian arteries are patent bilaterally.  Right carotid system: Right common carotid artery is well opacified from its origin to the right carotid bifurcation. Right internal carotid artery is widely patent from the carotid bifurcation to the skullbase. No evidence for dissection, flow-limiting stenosis, or occlusion.  Left carotid system: Left common carotid arteries well opacified from its origin to the carotid bifurcation. Left internal carotid artery is widely patent from the carotid bifurcation to the skullbase. No evidence for dissection, flow-limiting stenosis, or occlusion.  Vertebral arteries:Both vertebral arteries arise from the subclavian arteries. Vertebral arteries are widely patent to the skullbase without evidence for dissection or flow-limiting stenosis.  Skeleton: No acute osseous abnormality. No worrisome lytic or blastic osseous lesion.  Other neck: Incidental note made of a 7 mm hypodense nodule within the right lobe of thyroid, of doubtful clinical significance. No acute soft tissue abnormality within the neck. No adenopathy. Visualized lungs are clear.  CTA HEAD  Anterior circulation: The petrous, cavernous, and supra clinoid segments of the internal carotid arteries are widely patent. A1 segments, anterior communicating artery, and anterior cerebral arteries are well opacified and normal in appearance. M1 segments widely patent bilaterally without occlusion or stenosis. MCA bifurcations within normal limits. Distal MCA branches well opacified bilaterally and symmetric in appearance.  Posterior circulation: The vertebral arteries are widely patent to the vertebrobasilar junction. Posterior inferior cerebellar arteries are normal. Basilar artery widely  patent. Anterior inferior cerebellar arteries patent bilaterally. Superior cerebellar arteries well opacified. Posterior cerebral arteries widely patent bilaterally. Small posterior communicating arteries present bilaterally.  Venous sinuses: No abnormality within the venous sinuses.  Anatomic variants: No anatomic variant. No aneurysm. Tiny focal outpouching extending from the posterior aspect of the supraclinoid left ICA favored to reflect a normal infundibulum related to the left posterior communicating artery.  Delayed phase: No abnormal enhancement on delayed sequence.  IMPRESSION: Normal CTA of the head and neck.  No acute abnormality identified.   Electronically Signed   By: Rise Mu M.D.   On: 09/26/2014 20:29   Mr Brain Wo Contrast  09/27/2014   CLINICAL DATA:  Hypertensive urgency.  Syncope.  EXAM: MRI HEAD WITHOUT CONTRAST  TECHNIQUE: Multiplanar, multiecho pulse sequences of the brain and surrounding structures were obtained without intravenous contrast.  COMPARISON:  CTA 09/26/2014  FINDINGS: Negative for acute infarction.  Negative for chronic ischemic change  Negative for demyelinating disease. Cerebral white matter normal. Brainstem and basal ganglia normal  Ventricle size is normal. Cerebral volume is normal. Pituitary normal in size. Negative for Chiari malformation  Negative for intracranial hemorrhage  Negative for mass or edema.  No shift of the midline structures  Paranasal sinuses are clear  IMPRESSION: Normal   Electronically Signed   By: Marlan Palau M.D.   On: 09/27/2014 14:09   Mr Cervical Spine Wo Contrast  09/28/2014   CLINICAL DATA:  Neck pain and left arm numbness for 2 days.  EXAM: MRI CERVICAL SPINE WITHOUT CONTRAST  TECHNIQUE: Multiplanar, multisequence MR imaging of the cervical spine was performed. No intravenous contrast was administered.  COMPARISON:  None.  FINDINGS: No marrow signal abnormality suggestive of fracture, infection, or neoplasm. Normal cord  signal and morphology. 9 mm nodule in the right thyroid gland, incidental based on size. No evidence of neighboring adenopathy. No degenerative changes or foraminal/canal stenosis to explain the history.  IMPRESSION: Negative cervical spine MRI. No explanation for possible left radicular symptoms.   Electronically Signed   By: Marnee Spring M.D.   On: 09/28/2014 13:00   US Carotid Bilateral  09/27/2014   CLINICAL DATA:  34 year old female with transient ischemic attack  EXAM: BILATERAL CAROTID DUPLEX ULTRASOUND  TECHNIQUE: Wallace Cullens scale imaging, color Doppler and duplex ultrasound were performed of bilateral carotid and vertebral arteries in the neck.  COMPARISON:  Brain MRI 09/27/2014  FINDINGS: Criteria: Quantification of carotid stenosis is based on velocity parameters that correlate the residual internal carotid diameter with NASCET-based stenosis levels, using the diameter of the distal internal carotid lumen as the denominator for stenosis measurement.  The following velocity measurements were obtained:  RIGHT  ICA:  130/54 cm/sec  CCA:  81/25 cm/sec  SYSTOLIC ICA/CCA RATIO:  1.6  DIASTOLIC ICA/CCA RATIO:  2.2  ECA:  99 cm/sec  LEFT  ICA:  147/59 cm/sec  CCA:  93/21 cm/sec  SYSTOLIC ICA/CCA RATIO:  1.6  DIASTOLIC ICA/CCA RATIO:  2.8  ECA:  62 cm/sec  RIGHT CAROTID ARTERY: No  evidence of atherosclerotic plaque to suggest stenosis. Mild elevation of the peak systolic velocity in the mid ICA is likely artifactual. The spectral Doppler waveform remains unremarkable.  RIGHT VERTEBRAL ARTERY:  Patent with normal antegrade flow.  LEFT CAROTID ARTERY: No evidence of atherosclerotic plaque or focal vessel abnormality. Normal peak systolic velocity in the proximal and mid artery. Elevation of peak solid systolic velocity in the distal ICA is likely exaggerated.  LEFT VERTEBRAL ARTERY:  Patent with normal antegrade flow.  IMPRESSION: Negative bilateral duplex carotid ultrasound.  Signed,  Sterling Big, MD   [facility]  [facility]   Electronically Signed   By: Malachy Moan M.D.   On: 09/27/2014 14:53    Microbiology: No results found for this or any previous visit (from the past 240 hour(s)).   Labs: Basic Metabolic Panel:  Recent Labs Lab 09/26/14 1433 09/27/14 1047 09/28/14 0657  NA 138 140 139  K 3.3* 3.5 3.4*  CL 106 105 103  CO2 GLUCOSE 102* 93 97  BUN CREATININE 0.70 0.72 0.70  CALCIUM 9.2 8.9 9.0   Liver Function Tests:  Recent Labs Lab 09/26/14 1433  AST 40  ALT 36  ALKPHOS 105  BILITOT 0.4  PROT 8.8*  ALBUMIN 4.0   No results for input(s): LIPASE, AMYLASE in the last 168 hours. No results for input(s): AMMONIA in the last 168 hours. CBC:  Recent Labs Lab 09/26/14 1433 09/27/14 1047 09/28/14 0657  WBC 6.3 4.6 4.2  NEUTROABS 4.5  --   --   HGB 8.1* 7.5* 7.5*  HCT 28.5* 26.6* 26.1*  MCV 69.3* 70.0* 69.8*  PLT 287 283 289   Cardiac Enzymes:  Recent Labs Lab 09/26/14 1433 09/26/14 1813  TROPONINI 0.04* 0.04*   BNP: BNP (last 3 results) No results for input(s): BNP in the last 8760 hours.  ProBNP (last 3 results) No results for input(s): PROBNP in the last 8760 hours.  CBG: No results for input(s): GLUCAP in the last 168 hours.     Signed:  Kaymon Denomme  [facility] 09/28/2014, 6:51 PM

## 2014-09-28 NOTE — Evaluation (Addendum)
Physical Therapy Evaluation Patient Details Name: [name] MRN: [id number] DOB: 06/18/1980 Today's Date: 09/28/2014   History of Present Illness  [name] is an 34 y.o. female with hx of severe HTN, on several medications, noncompliance due to financial constrain, after argument with her 33 y o daughter, reportedly had a syncopal episode. EMS was summoned, and reportedly found that her BP was in the 300's systolic. She hadn't taken any of her meds for 2 months. She did not have any HA, nausea, vomiting, chest pain or SOB. In the ER, she was given clonidine 0.2mg , and her BP went down to 130. When I saw her, her BP was 180 systolic, alert and orient. She did complaint of tingling on her left side, and had some improvement over the ER course. Evaluation in the ER included a head CT negative for any acute processes. Her Cr and EKG was unremarkable. She was also evaluated by neurotelemetry, and was recommended CTA of the head and neck, which were normal. Hospitalist was asked to admit her for hypertensive urgency and possible TIA. CT and MRI have been negative.  Clinical Impression   Pt was seen for evaluation.  She reports having continued weakness of the LUE and radiating pain from the left chest down the left arm.  She is alert and oriented with no visual or speech deficit.  She demonstrates 3/5 strength in the LUE and 2+/5 strength in the LLE.  She states that she was up to have a shower yesterday and while she was able to stand on her left leg it did not feel secure.  Prior to getting her OOB to eval mobility, I took her BP and found it to be 187/97.  She has not yet had her AM BP meds.  I will wait for her BP to lower before assessing mobility later on today.    Follow Up Recommendations  (to be determined)    Equipment Recommendations       Recommendations for Other Services       Precautions / Restrictions Precautions Precautions: Fall Precaution Comments: elevated  BP Restrictions Weight Bearing Restrictions: No      Mobility  Bed Mobility               General bed mobility comments: not assessed at this time due to elevated BP  Transfers                    Ambulation/Gait                Stairs            Wheelchair Mobility    Modified Rankin (Stroke Patients Only)       Balance                                             Pertinent Vitals/Pain Pain Assessment: 0-10 Pain Score: 6  Pain Location: left chest and left arm Pain Descriptors / Indicators: Stabbing;Shooting Pain Intervention(s): Limited activity within patient's tolerance;Monitored during session;Relaxation    Home Living Family/patient expects to be discharged to:: Private residence Living Arrangements: Spouse/significant other Available Help at Discharge: Family;Available 24 hours/day Type of Home: Apartment Home Access: Stairs to enter Entrance Stairs-Rails: Right Entrance Stairs-Number of Steps: 2 flights of steps...apartment is on the 3rd floor Home Layout: One level Home Equipment: None  Prior Function Level of Independence: Independent               Hand Dominance   Dominant Hand: Right    Extremity/Trunk Assessment   Upper Extremity Assessment: LUE deficits/detail       LUE Deficits / Details: strength generally 3/5 with very slow movements of the arm   Lower Extremity Assessment: LLE deficits/detail   LLE Deficits / Details: demonstrates 2+/5 strength in the leg while supine     Communication   Communication: No difficulties  Cognition Arousal/Alertness: Awake/alert Behavior During Therapy: WFL for tasks assessed/performed Overall Cognitive Status: Within Functional Limits for tasks assessed                      General Comments      Exercises        Assessment/Plan    PT Assessment    PT Diagnosis  (to be determined)   PT Problem List    PT Treatment  Interventions     PT Goals (Current goals can be found in the Care Plan section)      Frequency     Barriers to discharge  2 flights of steps to her apartment      Co-evaluation               End of Session   Activity Tolerance: Treatment limited secondary to medical complications (Comment) Patient left: in bed;with call bell/phone within reach           Time: 0919-0940 PT Time Calculation (min) (ACUTE ONLY): 21 min   Charges:    PT Evaluation-One Visit  Initial Evaluation Tier One     PT G CodesMyrlene Broker Henry 09-30-2014, 10:16 AM

## 2014-09-28 NOTE — Progress Notes (Signed)
Pt off unit discharged home accompanied by husband.

## 2014-09-29 ENCOUNTER — Encounter (HOSPITAL_COMMUNITY): Payer: Self-pay | Admitting: Internal Medicine

## 2014-09-29 DIAGNOSIS — I5189 Other ill-defined heart diseases: Secondary | ICD-10-CM | POA: Diagnosis present

## 2014-09-29 LAB — TYPE AND SCREEN
ABO/RH(D): A POS
ANTIBODY SCREEN: NEGATIVE
UNIT DIVISION: 0

## 2014-10-01 ENCOUNTER — Ambulatory Visit: Payer: Medicaid Other | Attending: Family Medicine | Admitting: Family Medicine

## 2014-10-01 ENCOUNTER — Encounter: Payer: Self-pay | Admitting: Family Medicine

## 2014-10-01 VITALS — BP 154/94 | HR 68 | Temp 98.3°F | Resp 18 | Ht 65.0 in | Wt 206.0 lb

## 2014-10-01 DIAGNOSIS — G459 Transient cerebral ischemic attack, unspecified: Secondary | ICD-10-CM | POA: Insufficient documentation

## 2014-10-01 DIAGNOSIS — N920 Excessive and frequent menstruation with regular cycle: Secondary | ICD-10-CM | POA: Insufficient documentation

## 2014-10-01 DIAGNOSIS — D509 Iron deficiency anemia, unspecified: Secondary | ICD-10-CM

## 2014-10-01 DIAGNOSIS — I10 Essential (primary) hypertension: Secondary | ICD-10-CM | POA: Insufficient documentation

## 2014-10-01 DIAGNOSIS — F418 Other specified anxiety disorders: Secondary | ICD-10-CM

## 2014-10-01 DIAGNOSIS — D649 Anemia, unspecified: Secondary | ICD-10-CM | POA: Insufficient documentation

## 2014-10-01 DIAGNOSIS — R200 Anesthesia of skin: Secondary | ICD-10-CM | POA: Insufficient documentation

## 2014-10-01 DIAGNOSIS — M79602 Pain in left arm: Secondary | ICD-10-CM | POA: Insufficient documentation

## 2014-10-01 DIAGNOSIS — Z9114 Patient's other noncompliance with medication regimen: Secondary | ICD-10-CM | POA: Insufficient documentation

## 2014-10-01 DIAGNOSIS — D500 Iron deficiency anemia secondary to blood loss (chronic): Secondary | ICD-10-CM | POA: Insufficient documentation

## 2014-10-01 DIAGNOSIS — F17210 Nicotine dependence, cigarettes, uncomplicated: Secondary | ICD-10-CM | POA: Insufficient documentation

## 2014-10-01 DIAGNOSIS — F329 Major depressive disorder, single episode, unspecified: Secondary | ICD-10-CM

## 2014-10-01 DIAGNOSIS — F419 Anxiety disorder, unspecified: Secondary | ICD-10-CM | POA: Insufficient documentation

## 2014-10-01 MED ORDER — CLONIDINE HCL 0.2 MG PO TABS: 0.2000 mg | ORAL_TABLET | Freq: Two times a day (BID) | ORAL | Status: DC

## 2014-10-01 MED ORDER — PAROXETINE HCL 20 MG PO TABS: 20.0000 mg | ORAL_TABLET | Freq: Every day | ORAL | Status: DC

## 2014-10-01 MED ORDER — ASPIRIN EC 81 MG PO TBEC: 81.0000 mg | DELAYED_RELEASE_TABLET | Freq: Every day | ORAL | Status: DC

## 2014-10-01 NOTE — Progress Notes (Signed)
Subjective:    Patient ID: [name], female    DOB: 25-Jul-1980, 34 y.o.   MRN: [id number]  HPI   Admit date: 09/26/14  discharge date : 09/28/14     [name] is a 34 year old female with a history of hypertension who had been noncompliant with medications who was brought to [facility] ED via EMS after a syncopal episode and was noted to have a blood pressure in the 300s systolic by EMS.   On presentation she endorsed left facial and left-sided numbness and weakness and some chest pressure but no speech or swallowing difficulties.In the ED, she was given clonidine 0.2mg , and her SBP went down to 130. Evaluation in the ED also included a head CT which was negative for any acute processes. Her CXR was unremarkable. She was also evaluated by neurotelemetry who recommended CTA of the head and neck which was essentially negative. She was admitted for hypertensive urgency and possible TIA.    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 2-D echo revealed grade 2 diastolic dysfunction, MR LVH and EF 55-60%. She had a hemoglobin of 7.5 which improved to 8.7 at discharge after transfusion of 1 unit of PRBC. She endorsed a history of menorrhagia and was supposed to follow up with GYN.   Interval History: Complains she has left arm pain and numbness and her fingers feel strange. Complains of having panic attacks x2-3 yrs especially when she is in a room full of people or at social events. Sometimes has trouble sleeping and would have a racing heart and rapid breathing. States she is getting anxious sitting in the room with me.  Past Medical History  Diagnosis Date  . Hypertension   . Preeclampsia      x3  . Left sided numbness 09/27/2014    TIA vs hypertensive neuropathy. MRI brain and C-spine unremarkable  .  Hypertensive urgency 09/26/2014  . Microcytic anemia 09/27/2014    Heavy menstrual periods  . Diastolic dysfunction 09/28/2014    Grade 2. Mod LVH. EF 60%    Past Surgical History  Procedure Laterality Date  . C-sections      Family History  Problem Relation Age of Onset  . Hypertension Mother   . Hypertension Father   . Diabetes Father     History   Social History  . Marital Status: Single    Spouse Name: N/A  . Number of Children: N/A  . Years of Education: N/A   Occupational History  . Not on file.   Social History Main Topics  . Smoking status: Current Every Day Smoker -- 0.25 packs/day    Types: Cigarettes  . Smokeless tobacco: Not on file  . Alcohol Use: No  . Drug Use: No  . Sexual Activity: Not on file   Other Topics Concern  . Not on file   Social History Narrative    No Known Allergies  Current Outpatient Prescriptions on File Prior to Visit  Medication Sig Dispense Refill  . ferrous sulfate 325 (65 FE) MG tablet Take 1 tablet (325 mg total) by mouth 3 (three) times daily with meals. Iron supplement 90 tablet 3  . hydrochlorothiazide (HYDRODIURIL) 25 MG tablet Take 1 tablet (25 mg total) by mouth daily. 30 tablet 2  . lisinopril (PRINIVIL,ZESTRIL) 40 MG tablet  Take 1 tablet (40 mg total) by mouth daily. 30 tablet 2  . potassium chloride 20 MEQ TBCR Take 10 mEq by mouth daily. Take with you blood pressure medicine hydrochlorothiazide. 30 tablet 2  . HYDROcodone-acetaminophen (NORCO/VICODIN) 5-325 MG per tablet Take 1 tablet by mouth every 4 (four) hours as needed for moderate pain. (Patient not taking: Reported on 10/01/2014) 30 tablet 0   No current facility-administered medications on file prior to visit.     Review of Systems  Constitutional: Negative for activity change, appetite change and fatigue.  HENT: Negative for congestion, sinus pressure and sore throat.   Eyes: Negative for visual disturbance.  Respiratory: Negative for cough, chest  tightness, shortness of breath and wheezing.   Cardiovascular: Negative for chest pain and palpitations.  Gastrointestinal: Negative for abdominal pain, constipation and abdominal distention.  Endocrine: Negative for polydipsia.  Genitourinary: Negative for dysuria and frequency.  Musculoskeletal: Negative for back pain and arthralgias.       L arm pain  Skin: Negative for rash.  Neurological: Positive for numbness. Negative for tremors and light-headedness.  Hematological: Does not bruise/bleed easily.  Psychiatric/Behavioral: Negative for behavioral problems and agitation. The patient is nervous/anxious.          Objective: Filed Vitals:   10/01/14 0929  BP: 154/94  Pulse: 68  Temp: 98.3 F (36.8 C)  Resp: 18      Physical Exam  Constitutional: She is oriented to person, place, and time. She appears well-developed and well-nourished. No distress.  HENT:  Head: Normocephalic.  Right Ear: External ear normal.  Left Ear: External ear normal.  Nose: Nose normal.  Mouth/Throat: Oropharynx is clear and moist.  Eyes: Conjunctivae and EOM are normal. Pupils are equal, round, and reactive to light.  Neck: Normal range of motion. No JVD present.  Cardiovascular: Normal rate, regular rhythm, normal heart sounds and intact distal pulses.  Exam reveals no gallop.   No murmur heard. Pulmonary/Chest: Effort normal and breath sounds normal. No respiratory distress. She has no wheezes. She has no rales. She exhibits no tenderness.  Abdominal: Soft. Bowel sounds are normal. She exhibits no distension and no mass. There is no tenderness.  Musculoskeletal: Normal range of motion. She exhibits no edema or tenderness.  Neurological: She is alert and oriented to person, place, and time. She has normal reflexes. She displays normal reflexes. Coordination normal.  Motor strength is 4+/5 in LUE , 5/5 in RUE, 5/5 in LLE, 5/5 in RLE  Skin: Skin is warm and dry. She is not diaphoretic.  Psychiatric:  She has a normal mood and affect.           Assessment & Plan:  34 year old female recently hospitalized for hypertensive emergency and questionable TIA here for follow-up of high blood pressure and complains of anxiety and depressive symptoms with no suicidal ideations or intents.  Hypertension: Uncontrolled despite compliance with medications. I will increase clonidine from 0.1 to 0.2 mg twice a day and she will need a repeat of her blood pressure at her next office visit. Low-sodium diet advised.  TIA: This could explain left arm numbness and pain and mildly reduced hand grip. I have placed her on aspirin.  Anxiety and depression: Behavioral Health in to see the patient. 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  Anemia: Secondary to menorrhagia. She knows to call Dr.[facility] for an appointment and she is yet to do and I have reinforced the  need to do this.   Disclaimer: This note was dictated with voice recognition software. Similar sounding words can inadvertently be transcribed and this note may contain transcription errors which may not have been corrected upon publication of note.

## 2014-10-01 NOTE — Patient Instructions (Signed)

## 2014-10-01 NOTE — Progress Notes (Signed)
Patient hospitalized for panic attacks and high blood pressure. Patient reports still feeling a little weak. Patient reports having pain in left arm, at level 6, described as stabbing and shooting.

## 2014-10-01 NOTE — Progress Notes (Signed)
ASSESSMENT: Pt experiencing anxiety with panic attacks, along with depression. [facility]. Pt would benefit from pyschoeducation and supportive counseling about coping with symptoms of anxiety and depression. Stage of Change: action  PT AGREES TO FOLLOWING TREATMENT PLAN: 1. F/U with behavioral health consultant in 3  Weeks. Will talk more about depression and anxiety at next visit. 2. Psychiatric Medications: Paxil, take as prescribed. 3. Behavioral recommendation(s):   -Do breathing exercises daily. -Consider reading educational material given about coping with anxiety and panic attacks.  SUBJECTIVE: Pt. referred by Dr Venetia NightAmao for anxiety and depression:  Pt. here for referral regarding anxiety and panic attacks, and depression .  Pt. reports the following symptoms/concerns: Pt states that yes, she is feeling anxiety and depression symptoms, but feels the biggest issue is having panic attacks. She says that she wants to know what she can do, other than medication, to keep from having them. Pt is open to doing daily breathing exercises, and thinks it will help her to calm her anxiety.  Duration of problem: 2 years Severity: moderate  OBJECTIVE: Orientation & Cognition: Oriented x3. Thought processes normal and appropriate to situation. Mood: appropriate. Affect: appropriate Appearance: appropriate Risk of harm to self or others: no risk of harm to self or others Substance use: tobacco Psychiatric medication use: Unchanged from prior contact. Assessments administered: PSQ9-15/ PSQ7-19  Diagnosis: Anxiety and depression CPT Code: F41.8 -------------------------------------------- Other(s) present in the room: none  Time spent with patient in exam room: 20 minutes

## 2014-10-15 ENCOUNTER — Ambulatory Visit: Payer: Medicaid Other | Admitting: Family Medicine

## 2015-03-31 ENCOUNTER — Ambulatory Visit: Payer: Medicaid Other | Admitting: Family Medicine

## 2015-04-04 ENCOUNTER — Encounter (HOSPITAL_COMMUNITY): Payer: Self-pay | Admitting: Emergency Medicine

## 2015-04-04 ENCOUNTER — Emergency Department (HOSPITAL_COMMUNITY)
Admission: EM | Admit: 2015-04-04 | Discharge: 2015-04-04 | Disposition: A | Discharge status: Home / Self Care | Payer: Medicaid Other | Attending: Emergency Medicine | Admitting: Emergency Medicine

## 2015-04-04 DIAGNOSIS — Z720 Tobacco use: Secondary | ICD-10-CM | POA: Insufficient documentation

## 2015-04-04 DIAGNOSIS — Z862 Personal history of diseases of the blood and blood-forming organs and certain disorders involving the immune mechanism: Secondary | ICD-10-CM | POA: Diagnosis not present

## 2015-04-04 DIAGNOSIS — Z7982 Long term (current) use of aspirin: Secondary | ICD-10-CM | POA: Diagnosis not present

## 2015-04-04 DIAGNOSIS — E860 Dehydration: Secondary | ICD-10-CM

## 2015-04-04 DIAGNOSIS — I10 Essential (primary) hypertension: Secondary | ICD-10-CM | POA: Diagnosis not present

## 2015-04-04 DIAGNOSIS — K529 Noninfective gastroenteritis and colitis, unspecified: Secondary | ICD-10-CM | POA: Diagnosis not present

## 2015-04-04 DIAGNOSIS — Z79899 Other long term (current) drug therapy: Secondary | ICD-10-CM | POA: Diagnosis not present

## 2015-04-04 DIAGNOSIS — R1110 Vomiting, unspecified: Secondary | ICD-10-CM | POA: Diagnosis present

## 2015-04-04 LAB — I-STAT CHEM 8, ED
BUN: 7 mg/dL (ref 6–20)
CREATININE: 0.8 mg/dL (ref 0.44–1.00)
Calcium, Ion: 1.16 mmol/L (ref 1.12–1.23)
Chloride: 104 mmol/L (ref 101–111)
GLUCOSE: 110 mg/dL — AB (ref 65–99)
HEMATOCRIT: 35 % — AB (ref 36.0–46.0)
HEMOGLOBIN: 11.9 g/dL — AB (ref 12.0–15.0)
POTASSIUM: 3.2 mmol/L — AB (ref 3.5–5.1)
Sodium: 142 mmol/L (ref 135–145)
TCO2: 24 mmol/L (ref 0–100)

## 2015-04-04 MED ORDER — PROMETHAZINE HCL 25 MG/ML IJ SOLN
25.0000 mg | Freq: Once | INTRAMUSCULAR | Status: AC
  Administered 2015-04-04: 25 mg via INTRAVENOUS
  Filled 2015-04-04: qty 1

## 2015-04-04 MED ORDER — POTASSIUM CHLORIDE CRYS ER 20 MEQ PO TBCR
40.0000 meq | EXTENDED_RELEASE_TABLET | Freq: Once | ORAL | Status: AC
  Administered 2015-04-04: 40 meq via ORAL
  Filled 2015-04-04: qty 2

## 2015-04-04 MED ORDER — SODIUM CHLORIDE 0.9 % IV BOLUS (SEPSIS)
2000.0000 mL | Freq: Once | INTRAVENOUS | Status: AC
  Administered 2015-04-04: 2000 mL via INTRAVENOUS

## 2015-04-04 MED ORDER — HYDROCHLOROTHIAZIDE 25 MG PO TABS: 25.0000 mg | ORAL_TABLET | Freq: Every day | ORAL | Status: DC

## 2015-04-04 MED ORDER — LOPERAMIDE HCL 2 MG PO CAPS
4.0000 mg | ORAL_CAPSULE | Freq: Once | ORAL | Status: AC
  Administered 2015-04-04: 4 mg via ORAL
  Filled 2015-04-04: qty 2

## 2015-04-04 MED ORDER — PROMETHAZINE HCL 25 MG PO TABS: 25.0000 mg | ORAL_TABLET | Freq: Four times a day (QID) | ORAL | Status: DC | PRN

## 2015-04-04 MED ORDER — CLONIDINE HCL 0.2 MG PO TABS: 0.2000 mg | ORAL_TABLET | Freq: Two times a day (BID) | ORAL | Status: DC

## 2015-04-04 NOTE — Discharge Instructions (Signed)
Take imodium for diarhea.  Drink plenty of fluids.  Take your bp meds and follow up for bp check

## 2015-04-04 NOTE — ED Notes (Signed)
MD at bedside. 

## 2015-04-04 NOTE — ED Provider Notes (Signed)
CSN: [id number]     Arrival date & time 04/04/15  1155 History   First MD Initiated Contact with Patient 04/04/15 1339     Chief Complaint  Patient presents with  . Hypertension  . Vomiting  . Diarrhea     (Consider location/radiation/quality/duration/timing/severity/associated sxs/prior Treatment) Patient is a 34 y.o. female presenting with diarrhea. The history is provided by the patient (Patient states that she started with vomiting and diarrhea today also some fever and aches. She has not been taking her blood pressure medicine for months but just recently got insurance and will started).  Diarrhea Quality:  Mucous Severity:  Moderate Onset quality:  Sudden Timing:  Constant Progression:  Unchanged Relieved by:  Nothing Worsened by:  Nothing tried Associated symptoms: arthralgias   Associated symptoms: no abdominal pain and no headaches     Past Medical History  Diagnosis Date  . Hypertension   . Preeclampsia      x3  . Left sided numbness 09/27/2014    TIA vs hypertensive neuropathy. MRI brain and C-spine unremarkable  . Hypertensive urgency 09/26/2014  . Microcytic anemia 09/27/2014    Heavy menstrual periods  . Diastolic dysfunction 09/28/2014    Grade 2. Mod LVH. EF 60%   Past Surgical History  Procedure Laterality Date  . C-sections     Family History  Problem Relation Age of Onset  . Hypertension Mother   . Hypertension Father   . Diabetes Father    Social History  Substance Use Topics  . Smoking status: Current Every Day Smoker -- 0.25 packs/day    Types: Cigarettes  . Smokeless tobacco: None  . Alcohol Use: No   OB History    Gravida Para Term Preterm AB TAB SAB Ectopic Multiple Living   Review of Systems  Constitutional: Negative for appetite change and fatigue.  HENT: Negative for congestion, ear discharge and sinus pressure.   Eyes: Negative for discharge.  Respiratory: Negative for cough.   Cardiovascular: Negative for chest  pain.  Gastrointestinal: Positive for nausea and diarrhea. Negative for abdominal pain.  Genitourinary: Negative for frequency and hematuria.  Musculoskeletal: Positive for arthralgias. Negative for back pain.  Skin: Negative for rash.  Neurological: Negative for seizures and headaches.  Psychiatric/Behavioral: Negative for hallucinations.      Allergies  Review of patient's allergies indicates no known allergies.  Home Medications   Prior to Admission medications   Medication Sig Start Date End Date Taking? Authorizing Provider  aspirin EC 81 MG tablet Take 1 tablet (81 mg total) by mouth daily. 10/01/14  Yes Jaclyn Shaggy, MD  cloNIDine (CATAPRES) 0.2 MG tablet Take 1 tablet (0.2 mg total) by mouth 2 (two) times daily. New medication to help treat your high blood pressure 10/01/14  Yes Jaclyn Shaggy, MD  ferrous sulfate 325 (65 FE) MG tablet Take 1 tablet (325 mg total) by mouth 3 (three) times daily with meals. Iron supplement 09/28/14  Yes Elliot Cousin, MD  hydrochlorothiazide (HYDRODIURIL) 25 MG tablet Take 1 tablet (25 mg total) by mouth daily. 09/28/14  Yes Elliot Cousin, MD  lisinopril (PRINIVIL,ZESTRIL) 40 MG tablet Take 1 tablet (40 mg total) by mouth daily. 09/28/14  Yes Elliot Cousin, MD  PARoxetine (PAXIL) 20 MG tablet Take 1 tablet (20 mg total) by mouth daily. 10/01/14  Yes Jaclyn Shaggy, MD  HYDROcodone-acetaminophen (NORCO/VICODIN) 5-325 MG per tablet Take 1 tablet by mouth every 4 (four) hours  as needed for moderate pain. Patient not taking: Reported on 10/01/2014 09/28/14   Elliot Cousinenise Fisher, MD  potassium chloride 20 MEQ TBCR Take 10 mEq by mouth daily. Take with you blood pressure medicine hydrochlorothiazide. 09/28/14   Elliot Cousinenise Fisher, MD   BP 161/109 mmHg  Pulse 86  Temp(Src) 98.3 F (36.8 C) (Oral)  Resp 25  Ht 5\' 6"  (1.676 m)  Wt 180 lb (81.647 kg)  BMI 29.07 kg/m2  SpO2 100%  LMP 03/29/2015 Physical Exam  Constitutional: She is oriented to person, place, and time. She appears  well-developed.  HENT:  Head: Normocephalic.  Eyes: Conjunctivae and EOM are normal. No scleral icterus.  Neck: Neck supple. No thyromegaly present.  Cardiovascular: Normal rate and regular rhythm.  Exam reveals no gallop and no friction rub.   No murmur heard. Pulmonary/Chest: No stridor. She has no wheezes. She has no rales. She exhibits no tenderness.  Abdominal: She exhibits no distension. There is no tenderness. There is no rebound.  Musculoskeletal: Normal range of motion. She exhibits no edema.  Lymphadenopathy:    She has no cervical adenopathy.  Neurological: She is oriented to person, place, and time. She exhibits normal muscle tone. Coordination normal.  Skin: No rash noted. No erythema.  Psychiatric: She has a normal mood and affect. Her behavior is normal.    ED Course  Procedures (including critical care time) Labs Review Labs Reviewed  I-STAT CHEM 8, ED - Abnormal; Notable for the following:    Potassium 3.2 (*)    Glucose, Bld 110 (*)    Hemoglobin 11.9 (*)    HCT 35.0 (*)    All other components within normal limits    Imaging Review No results found. I have personally reviewed and evaluated these images and lab results as part of my medical decision-making.   EKG Interpretation None      MDM   Final diagnoses:  None    Labs unremarkable patient has gastroenteritis. She will be given Phenergan and told to take Imodium as needed and drink plenty of fluids. She also has uncontrolled blood pressure she had not been taking her medicine I have written her prescription for clonidine and hydrochlorothiazide which is what she should be taking she states that she will get that filled.   Bethann BerkshireJoseph Ivanka Kirshner, MD 04/04/15 [phone]

## 2015-04-04 NOTE — ED Notes (Signed)
Pt made aware to return if symptoms worsen or if any life threatening symptoms occur.   

## 2015-04-04 NOTE — ED Notes (Signed)
Having n/v/d this am.  BP 234/148 at home.  Currently BP 213/128.  Have been off BP medication for 2-3 months.

## 2017-11-08 ENCOUNTER — Encounter (HOSPITAL_COMMUNITY): Payer: Self-pay

## 2017-11-08 ENCOUNTER — Emergency Department (HOSPITAL_COMMUNITY)
Admission: EM | Admit: 2017-11-08 | Discharge: 2017-11-08 | Disposition: A | Discharge status: Home / Self Care | Payer: Self-pay | Attending: Emergency Medicine | Admitting: Emergency Medicine

## 2017-11-08 ENCOUNTER — Other Ambulatory Visit: Payer: Self-pay

## 2017-11-08 ENCOUNTER — Emergency Department (HOSPITAL_COMMUNITY): Payer: Self-pay

## 2017-11-08 DIAGNOSIS — Z79899 Other long term (current) drug therapy: Secondary | ICD-10-CM | POA: Insufficient documentation

## 2017-11-08 DIAGNOSIS — I110 Hypertensive heart disease with heart failure: Secondary | ICD-10-CM | POA: Insufficient documentation

## 2017-11-08 DIAGNOSIS — F17210 Nicotine dependence, cigarettes, uncomplicated: Secondary | ICD-10-CM | POA: Insufficient documentation

## 2017-11-08 DIAGNOSIS — I10 Essential (primary) hypertension: Secondary | ICD-10-CM

## 2017-11-08 DIAGNOSIS — Z8673 Personal history of transient ischemic attack (TIA), and cerebral infarction without residual deficits: Secondary | ICD-10-CM | POA: Insufficient documentation

## 2017-11-08 DIAGNOSIS — I5030 Unspecified diastolic (congestive) heart failure: Secondary | ICD-10-CM | POA: Insufficient documentation

## 2017-11-08 DIAGNOSIS — Z3202 Encounter for pregnancy test, result negative: Secondary | ICD-10-CM | POA: Insufficient documentation

## 2017-11-08 LAB — COMPREHENSIVE METABOLIC PANEL
ALT: 14 U/L (ref 14–54)
AST: 21 U/L (ref 15–41)
Albumin: 4.5 g/dL (ref 3.5–5.0)
Alkaline Phosphatase: 96 U/L (ref 38–126)
Anion gap: 10 (ref 5–15)
BUN: 15 mg/dL (ref 6–20)
CO2: 22 mmol/L (ref 22–32)
Calcium: 9.8 mg/dL (ref 8.9–10.3)
Chloride: 107 mmol/L (ref 101–111)
Creatinine, Ser: 1.05 mg/dL — ABNORMAL HIGH (ref 0.44–1.00)
GFR calc Af Amer: >60 mL/min (ref >60)
GFR calc non Af Amer: >60 mL/min (ref >60)
Glucose, Bld: 91 mg/dL (ref 65–99)
Potassium: 3.4 mmol/L — ABNORMAL LOW (ref 3.5–5.1)
Sodium: 139 mmol/L (ref 135–145)
Total Bilirubin: 0.4 mg/dL (ref 0.3–1.2)
Total Protein: 9.4 g/dL — ABNORMAL HIGH (ref 6.5–8.1)

## 2017-11-08 LAB — URINALYSIS, ROUTINE W REFLEX MICROSCOPIC
Bacteria, UA: NONE SEEN
Bilirubin Urine: NEGATIVE
Glucose, UA: NEGATIVE mg/dL
Ketones, ur: NEGATIVE mg/dL
Leukocytes, UA: NEGATIVE
Nitrite: NEGATIVE
Protein, ur: NEGATIVE mg/dL
Specific Gravity, Urine: 1.004 — ABNORMAL LOW (ref 1.005–1.030)
pH: 6 (ref 5.0–8.0)

## 2017-11-08 LAB — CBC WITH DIFFERENTIAL/PLATELET
Basophils Absolute: 0 10*3/uL (ref 0.0–0.1)
Basophils Relative: 0 %
Eosinophils Absolute: 0.1 10*3/uL (ref 0.0–0.7)
Eosinophils Relative: 1 %
HCT: 27.1 % — ABNORMAL LOW (ref 36.0–46.0)
Hemoglobin: 7.1 g/dL — ABNORMAL LOW (ref 12.0–15.0)
Lymphocytes Relative: 15 %
Lymphs Abs: 1.1 10*3/uL (ref 0.7–4.0)
MCH: 16.1 pg — ABNORMAL LOW (ref 26.0–34.0)
MCHC: 26.2 g/dL — ABNORMAL LOW (ref 30.0–36.0)
MCV: 61.5 fL — ABNORMAL LOW (ref 78.0–100.0)
Monocytes Absolute: 0.2 10*3/uL (ref 0.1–1.0)
Monocytes Relative: 3 %
Neutro Abs: 5.9 10*3/uL (ref 1.7–7.7)
Neutrophils Relative %: 81 %
Platelets: 357 10*3/uL (ref 150–400)
RBC: 4.41 MIL/uL (ref 3.87–5.11)
RDW: 19.6 % — ABNORMAL HIGH (ref 11.5–15.5)
WBC: 7.3 10*3/uL (ref 4.0–10.5)

## 2017-11-08 LAB — PREGNANCY, URINE: Preg Test, Ur: NEGATIVE

## 2017-11-08 MED ORDER — SODIUM CHLORIDE 0.9 % IV SOLN
INTRAVENOUS | Status: DC
  Administered 2017-11-08: 14:00:00 via INTRAVENOUS

## 2017-11-08 MED ORDER — HYDROCHLOROTHIAZIDE 25 MG PO TABS
25.0000 mg | ORAL_TABLET | Freq: Every day | ORAL | Status: DC
  Administered 2017-11-08: 25 mg via ORAL
  Filled 2017-11-08: qty 1

## 2017-11-08 MED ORDER — METOCLOPRAMIDE HCL 5 MG/ML IJ SOLN
10.0000 mg | Freq: Once | INTRAMUSCULAR | Status: AC
  Administered 2017-11-08: 10 mg via INTRAVENOUS
  Filled 2017-11-08: qty 2

## 2017-11-08 MED ORDER — LISINOPRIL 10 MG PO TABS
40.0000 mg | ORAL_TABLET | Freq: Once | ORAL | Status: AC
  Administered 2017-11-08: 40 mg via ORAL
  Filled 2017-11-08: qty 4

## 2017-11-08 MED ORDER — DIPHENHYDRAMINE HCL 50 MG/ML IJ SOLN
25.0000 mg | Freq: Once | INTRAMUSCULAR | Status: AC
  Administered 2017-11-08: 25 mg via INTRAVENOUS
  Filled 2017-11-08: qty 1

## 2017-11-08 MED ORDER — CLONIDINE HCL 0.1 MG PO TABS
0.1000 mg | ORAL_TABLET | Freq: Once | ORAL | Status: AC
  Administered 2017-11-08: 0.1 mg via ORAL
  Filled 2017-11-08: qty 1

## 2017-11-08 MED ORDER — LISINOPRIL-HYDROCHLOROTHIAZIDE 20-25 MG PO TABS: 1.0000 | ORAL_TABLET | Freq: Every day | ORAL | 1 refills | Status: DC

## 2017-11-08 MED ORDER — AMLODIPINE BESYLATE 5 MG PO TABS: 5.0000 mg | ORAL_TABLET | Freq: Every day | ORAL | 0 refills | Status: DC

## 2017-11-08 MED ORDER — LABETALOL HCL 5 MG/ML IV SOLN
20.0000 mg | Freq: Once | INTRAVENOUS | Status: AC
  Administered 2017-11-08: 20 mg via INTRAVENOUS
  Filled 2017-11-08: qty 4

## 2017-11-08 NOTE — ED Provider Notes (Signed)
[facility] EMERGENCY DEPARTMENT Provider Note   CSN: [id number] Arrival date & time: 11/08/17  1215     History   Chief Complaint Chief Complaint  Patient presents with  . Hypertension    HPI [name] is a 37 y.o. female.  HPI   37 year old female coming from work that she had an episode of dizziness.  For the past 3 days she has had a generalized headache in the past day she has been having some flashing spots in both eyes.  On EMS arrival she was extremely hypertensive with reported BPs of 290/160, 300/160 and 288/160.  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  The time and also appears that she had similar symptoms as to what she had earlier today.  She has been previously prescribed Norvasc, hydrochlorothiazide, lisinopril and clonidine.  She has not taken these in several months because of financial issues.  No acute respiratory complaints.  No swelling.  No urinary complaints.  Past Medical History:  Diagnosis Date  . Diastolic dysfunction 09/28/2014   Grade 2. Mod LVH. EF 60%  . Hypertension   . Hypertensive urgency 09/26/2014  . 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 MRI brain and C-spine unremarkable  . Microcytic anemia 09/27/2014   Heavy menstrual periods  . Preeclampsia     x3    Patient Active Problem List   Diagnosis Date Noted  . Hypertension 10/01/2014  . Anemia 10/01/2014  . Anxiety and depression 10/01/2014  . Diastolic dysfunction 09/29/2014  . Syncope 09/27/2014  . Left sided numbness 09/27/2014  . Chest pressure 09/27/2014  . Microcytic anemia 09/27/2014  . Hypertensive urgency 09/26/2014  . TIA (transient ischemic attack) 09/26/2014  . Tobacco abuse 09/26/2014  . 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 Preeclampsia     Past Surgical History:  Procedure Laterality Date  . c-sections       OB History    Gravida  1   Para  1   Term      Preterm  1   AB      Living        SAB      TAB      Ectopic      Multiple      Live Births               Home Medications    Prior to Admission medications   Medication Sig Start Date End Date Taking? Authorizing Provider  cloNIDine (CATAPRES) 0.2 MG tablet Take 1 tablet (0.2 mg total) by mouth 2 (two) times daily. 04/04/15   Bethann Berkshire, MD  hydrochlorothiazide (HYDRODIURIL) 25 MG tablet Take 1 tablet (25 mg total) by mouth daily. 04/04/15   Bethann Berkshire, MD  HYDROcodone-acetaminophen (NORCO/VICODIN) 5-325 MG per tablet Take 1 tablet by mouth every 4 (four) hours as needed for moderate pain. Patient not taking: Reported on 10/01/2014 09/28/14   Elliot Cousin, MD  lisinopril (PRINIVIL,ZESTRIL) 40 MG tablet Take 1 tablet (40 mg total) by mouth daily. 09/28/14   Elliot Cousin, MD  PARoxetine (PAXIL) 20 MG tablet Take 1 tablet (20 mg total) by mouth daily. 10/01/14   Hoy Register, MD  potassium chloride 20 MEQ TBCR Take 10 mEq by mouth daily. Take with you blood pressure medicine hydrochlorothiazide. 09/28/14   Elliot Cousin, MD  promethazine (PHENERGAN) 25 MG tablet Take 1 tablet (25 mg total) by mouth  every 6 (six) hours as needed for nausea or vomiting. 04/04/15   Bethann Berkshire, MD    Family History Family History  Problem Relation Age of Onset  . Hypertension Mother   . Hypertension Father   . Diabetes Father     Social History Social History   Tobacco Use  . Smoking status: Current Every Day Smoker    Packs/day: 0.25    Types: Cigarettes  . Smokeless tobacco: Never Used  Substance Use Topics  . Alcohol use: No  . Drug use: Not on file     Allergies   Patient has no known allergies.   Review of Systems Review of Systems  All systems reviewed and negative, other than as noted in HPI.  Physical Exam Updated Vital Signs BP (!) 249/117 (BP Location: Left Arm)   Pulse 81   Temp 98.9 F (37.2 C) (Oral)   Resp 14   Ht 5\' 6"  (1.676 m)    Wt 90.7 kg (200 lb)   LMP 11/08/2017   SpO2 99%   BMI 32.28 kg/m   Physical Exam  Constitutional: She is oriented to person, place, and time. She appears well-developed and well-nourished. No distress.  HENT:  Head: Normocephalic and atraumatic.  Eyes: Conjunctivae are normal. Right eye exhibits no discharge. Left eye exhibits no discharge.  Neck: Neck supple.  Cardiovascular: Normal rate, regular rhythm and normal heart sounds. Exam reveals no gallop and no friction rub.  No murmur heard. Pulmonary/Chest: Effort normal and breath sounds normal. No respiratory distress.  Abdominal: Soft. She exhibits no distension. There is no tenderness.  Musculoskeletal: She exhibits no edema or tenderness.  Neurological: She is alert and oriented to person, place, and time. No cranial nerve deficit. Coordination normal.  Skin: Skin is warm and dry.  Psychiatric: She has a normal mood and affect. Her behavior is normal. Thought content normal.  Nursing note and vitals reviewed.    ED Treatments / Results  Labs (all labs ordered are listed, but only abnormal results are displayed) Labs Reviewed  CBC WITH DIFFERENTIAL/PLATELET - Abnormal; Notable for the following components:      Result Value   Hemoglobin 7.1 (*)    HCT 27.1 (*)    MCV 61.5 (*)    MCH 16.1 (*)    MCHC 26.2 (*)    RDW 19.6 (*)    All other components within normal limits  COMPREHENSIVE METABOLIC PANEL - Abnormal; Notable for the following components:   Potassium 3.4 (*)    Creatinine, Ser 1.05 (*)    Total Protein 9.4 (*)    All other components within normal limits  URINALYSIS, ROUTINE W REFLEX MICROSCOPIC  PREGNANCY, URINE    EKG None  Radiology Ct Head Wo Contrast  Result Date: 11/08/2017 CLINICAL DATA:  Dizziness and headache for 3 days. EXAM: CT HEAD WITHOUT CONTRAST TECHNIQUE: Contiguous axial images were obtained from the base of the skull through the vertex without intravenous contrast. COMPARISON:   09/27/2014 FINDINGS: Brain: No evidence of acute infarction, hemorrhage, hydrocephalus, extra-axial collection or mass lesion/mass effect. Vascular: No hyperdense vessel or unexpected calcification. Skull: Normal. Negative for fracture or focal lesion. Sinuses/Orbits: No acute finding. Other: None. IMPRESSION: Normal exam. Electronically Signed   By: Signa Kell M.D.   On: 11/08/2017 13:22    Procedures Procedures (including critical care time)  Medications Ordered in ED Medications  labetalol (NORMODYNE,TRANDATE) injection 20 mg (has no administration in time range)  0.9 %  sodium chloride infusion (has  no administration in time range)  metoCLOPramide (REGLAN) injection 10 mg (has no administration in time range)  diphenhydrAMINE (BENADRYL) injection 25 mg (has no administration in time range)     Initial Impression / Assessment and Plan / ED Course  I have reviewed the triage vital signs and the nursing notes.  Pertinent labs & imaging results that were available during my care of the patient were reviewed by me and considered in my medical decision making (see chart for details).     37 year old female with hypertension and headache.  Arrived extremely hypertensive.  She was given labetalol and her previously prescribed medications with some.  She is still hypertensive although improved from arrival.  No emergent need for further reduction at this point.  Stressed multiple times to patient the importance of adhering to her medicine regimen.  She was provided with prescriptions.  Return precautions were discussed.  Discussed the need to keep a blood pressure log and to follow back up with her PCP.  She is to take this log with her on her next appointment.  Final Clinical Impressions(s) / ED Diagnoses   Final diagnoses:  Hypertension, unspecified type    ED Discharge Orders        Ordered    lisinopril-hydrochlorothiazide (PRINZIDE,ZESTORETIC) 20-25 MG tablet  Daily     11/08/17  1538    amLODipine (NORVASC) 5 MG tablet  Daily     11/08/17 1538       Raeford RazorKohut, Liberta Gimpel, MD 11/10/17 1343

## 2017-11-08 NOTE — ED Triage Notes (Signed)
Pt states she was at work and started feeling dizzy, and has had generalized headache for 3 days. Pt states she has been under a lot of stress. EMS BP 288/160, 290/160, 300/160. Pt states she is having spots before her eyes. Pt denies history of chronic hypertension but states had preeclampsia with all 3 children.

## 2017-11-14 ENCOUNTER — Inpatient Hospital Stay (HOSPITAL_COMMUNITY)
Admission: EM | Admit: 2017-11-14 | Discharge: 2017-11-16 | DRG: 305 | Disposition: A | Discharge status: Home / Self Care | Payer: Self-pay | Attending: Internal Medicine | Admitting: Internal Medicine

## 2017-11-14 ENCOUNTER — Other Ambulatory Visit: Payer: Self-pay

## 2017-11-14 ENCOUNTER — Encounter (HOSPITAL_COMMUNITY): Payer: Self-pay

## 2017-11-14 DIAGNOSIS — D62 Acute posthemorrhagic anemia: Secondary | ICD-10-CM | POA: Diagnosis present

## 2017-11-14 DIAGNOSIS — Z8673 Personal history of transient ischemic attack (TIA), and cerebral infarction without residual deficits: Secondary | ICD-10-CM

## 2017-11-14 DIAGNOSIS — D509 Iron deficiency anemia, unspecified: Secondary | ICD-10-CM | POA: Diagnosis present

## 2017-11-14 DIAGNOSIS — Z833 Family history of diabetes mellitus: Secondary | ICD-10-CM

## 2017-11-14 DIAGNOSIS — F17210 Nicotine dependence, cigarettes, uncomplicated: Secondary | ICD-10-CM | POA: Diagnosis present

## 2017-11-14 DIAGNOSIS — Z79899 Other long term (current) drug therapy: Secondary | ICD-10-CM

## 2017-11-14 DIAGNOSIS — R42 Dizziness and giddiness: Secondary | ICD-10-CM

## 2017-11-14 DIAGNOSIS — Z791 Long term (current) use of non-steroidal anti-inflammatories (NSAID): Secondary | ICD-10-CM

## 2017-11-14 DIAGNOSIS — Z8249 Family history of ischemic heart disease and other diseases of the circulatory system: Secondary | ICD-10-CM

## 2017-11-14 DIAGNOSIS — Z716 Tobacco abuse counseling: Secondary | ICD-10-CM

## 2017-11-14 DIAGNOSIS — Z9114 Patient's other noncompliance with medication regimen: Secondary | ICD-10-CM

## 2017-11-14 DIAGNOSIS — I110 Hypertensive heart disease with heart failure: Secondary | ICD-10-CM | POA: Diagnosis present

## 2017-11-14 DIAGNOSIS — N920 Excessive and frequent menstruation with regular cycle: Secondary | ICD-10-CM | POA: Diagnosis present

## 2017-11-14 DIAGNOSIS — E876 Hypokalemia: Secondary | ICD-10-CM | POA: Diagnosis present

## 2017-11-14 DIAGNOSIS — I5189 Other ill-defined heart diseases: Secondary | ICD-10-CM

## 2017-11-14 DIAGNOSIS — F329 Major depressive disorder, single episode, unspecified: Secondary | ICD-10-CM | POA: Diagnosis present

## 2017-11-14 DIAGNOSIS — Z720 Tobacco use: Secondary | ICD-10-CM | POA: Diagnosis present

## 2017-11-14 DIAGNOSIS — D500 Iron deficiency anemia secondary to blood loss (chronic): Secondary | ICD-10-CM

## 2017-11-14 DIAGNOSIS — Z599 Problem related to housing and economic circumstances, unspecified: Secondary | ICD-10-CM

## 2017-11-14 DIAGNOSIS — F419 Anxiety disorder, unspecified: Secondary | ICD-10-CM | POA: Diagnosis present

## 2017-11-14 DIAGNOSIS — I10 Essential (primary) hypertension: Secondary | ICD-10-CM | POA: Diagnosis present

## 2017-11-14 DIAGNOSIS — I160 Hypertensive urgency: Principal | ICD-10-CM | POA: Diagnosis present

## 2017-11-14 DIAGNOSIS — I5032 Chronic diastolic (congestive) heart failure: Secondary | ICD-10-CM | POA: Diagnosis present

## 2017-11-14 LAB — BASIC METABOLIC PANEL
Anion gap: 10 (ref 5–15)
BUN: 22 mg/dL — AB (ref 6–20)
CALCIUM: 9.5 mg/dL (ref 8.9–10.3)
CO2: 26 mmol/L (ref 22–32)
CREATININE: 1.13 mg/dL — AB (ref 0.44–1.00)
Chloride: 105 mmol/L (ref 101–111)
GFR calc Af Amer: >60 mL/min (ref >60)
GLUCOSE: 106 mg/dL — AB (ref 65–99)
Potassium: 2.8 mmol/L — ABNORMAL LOW (ref 3.5–5.1)
Sodium: 141 mmol/L (ref 135–145)

## 2017-11-14 LAB — CBC
HCT: 23.3 % — ABNORMAL LOW (ref 36.0–46.0)
HEMOGLOBIN: 6.1 g/dL — AB (ref 12.0–15.0)
MCH: 15.9 pg — AB (ref 26.0–34.0)
MCHC: 26.2 g/dL — AB (ref 30.0–36.0)
MCV: 60.8 fL — ABNORMAL LOW (ref 78.0–100.0)
PLATELETS: 328 10*3/uL (ref 150–400)
RBC: 3.83 MIL/uL — ABNORMAL LOW (ref 3.87–5.11)
RDW: 19.5 % — AB (ref 11.5–15.5)
WBC: 7 10*3/uL (ref 4.0–10.5)

## 2017-11-14 LAB — CBG MONITORING, ED: GLUCOSE-CAPILLARY: 102 mg/dL — AB (ref 65–99)

## 2017-11-14 LAB — PREPARE RBC (CROSSMATCH)

## 2017-11-14 MED ORDER — SODIUM CHLORIDE 0.9% FLUSH
3.0000 mL | Freq: Two times a day (BID) | INTRAVENOUS | Status: DC
  Administered 2017-11-15 – 2017-11-16 (×4): 3 mL via INTRAVENOUS

## 2017-11-14 MED ORDER — SODIUM CHLORIDE 0.9% IV SOLUTION: Freq: Once | INTRAVENOUS | Status: DC

## 2017-11-14 MED ORDER — AMLODIPINE BESYLATE 5 MG PO TABS
5.0000 mg | ORAL_TABLET | Freq: Every day | ORAL | Status: DC
  Administered 2017-11-15 – 2017-11-16 (×2): 5 mg via ORAL
  Filled 2017-11-14 (×2): qty 1

## 2017-11-14 MED ORDER — ACETAMINOPHEN 325 MG PO TABS
650.0000 mg | ORAL_TABLET | Freq: Four times a day (QID) | ORAL | Status: DC | PRN
  Administered 2017-11-15: 650 mg via ORAL
  Filled 2017-11-14: qty 2

## 2017-11-14 MED ORDER — ACETAMINOPHEN 650 MG RE SUPP
650.0000 mg | Freq: Four times a day (QID) | RECTAL | Status: DC | PRN
Start: 2017-11-14 — End: 2017-11-16

## 2017-11-14 MED ORDER — LISINOPRIL-HYDROCHLOROTHIAZIDE 20-25 MG PO TABS: 1.0000 | ORAL_TABLET | Freq: Every day | ORAL | Status: DC

## 2017-11-14 MED ORDER — CLONIDINE HCL 0.1 MG PO TABS
0.1000 mg | ORAL_TABLET | Freq: Two times a day (BID) | ORAL | Status: DC
  Administered 2017-11-15: 0.1 mg via ORAL
  Filled 2017-11-14: qty 1

## 2017-11-14 MED ORDER — ONDANSETRON HCL 4 MG/2ML IJ SOLN: 4.0000 mg | Freq: Four times a day (QID) | INTRAMUSCULAR | Status: DC | PRN

## 2017-11-14 MED ORDER — SODIUM CHLORIDE 0.9 % IV SOLN: 250.0000 mL | INTRAVENOUS | Status: DC | PRN

## 2017-11-14 MED ORDER — POTASSIUM CHLORIDE 10 MEQ/100ML IV SOLN
10.0000 meq | Freq: Once | INTRAVENOUS | Status: AC
  Administered 2017-11-14: 10 meq via INTRAVENOUS
  Filled 2017-11-14: qty 100

## 2017-11-14 MED ORDER — HYDRALAZINE HCL 20 MG/ML IJ SOLN
10.0000 mg | INTRAMUSCULAR | Status: DC | PRN
  Administered 2017-11-15 (×3): 10 mg via INTRAVENOUS
  Filled 2017-11-14 (×3): qty 1

## 2017-11-14 MED ORDER — SODIUM CHLORIDE 0.9% FLUSH: 3.0000 mL | INTRAVENOUS | Status: DC | PRN

## 2017-11-14 MED ORDER — ONDANSETRON HCL 4 MG/2ML IJ SOLN: 4.0000 mg | Freq: Once | INTRAMUSCULAR | Status: DC

## 2017-11-14 MED ORDER — POTASSIUM CHLORIDE CRYS ER 20 MEQ PO TBCR
40.0000 meq | EXTENDED_RELEASE_TABLET | Freq: Once | ORAL | Status: AC
  Administered 2017-11-14: 40 meq via ORAL
  Filled 2017-11-14: qty 2

## 2017-11-14 MED ORDER — ONDANSETRON HCL 4 MG PO TABS
4.0000 mg | ORAL_TABLET | Freq: Four times a day (QID) | ORAL | Status: DC | PRN
  Administered 2017-11-15: 4 mg via ORAL
  Filled 2017-11-14: qty 1

## 2017-11-14 MED ORDER — ONDANSETRON HCL 4 MG/2ML IJ SOLN
INTRAMUSCULAR | Status: AC
  Filled 2017-11-14: qty 2

## 2017-11-14 NOTE — ED Provider Notes (Signed)
[facility] EMERGENCY DEPARTMENT Provider Note   CSN: [id number] Arrival date & time: 11/14/17  2052     History   Chief Complaint Chief Complaint  Patient presents with  . Dizziness    HPI [name] is a 37 y.o. female.  Patient complains of dizziness and feels like she is in a pass out.  She is been having problems with her blood pressure.  Patient also has significantly heavy menstrual.  The history is provided by the patient. No language interpreter was used.  Weakness  Primary symptoms include dizziness.  Primary symptoms include no focal weakness. This is a new problem. The current episode started 6 to 12 hours ago. The problem has not changed since onset.There was no focality noted. There has been no fever. Pertinent negatives include no shortness of breath, no chest pain and no headaches. There were no medications administered prior to arrival. Associated medical issues do not include trauma.    Past Medical History:  Diagnosis Date  . Diastolic dysfunction 09/28/2014   Grade 2. Mod LVH. EF 60%  . Hypertension   . Hypertensive urgency 09/26/2014  . 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 MRI brain and C-spine unremarkable  . Microcytic anemia 09/27/2014   Heavy menstrual periods  . Preeclampsia     x3    Patient Active Problem List   Diagnosis Date Noted  . Hypertension 10/01/2014  . Anemia 10/01/2014  . Anxiety and depression 10/01/2014  . Diastolic dysfunction 09/29/2014  . Syncope 09/27/2014  . Left sided numbness 09/27/2014  . Chest pressure 09/27/2014  . Microcytic anemia 09/27/2014  . Hypertensive urgency 09/26/2014  . TIA (transient ischemic attack) 09/26/2014  . Tobacco abuse 09/26/2014  . 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 Preeclampsia     Past Surgical History:  Procedure Laterality Date  . c-sections       OB History    Gravida  1   Para  1   Term      Preterm  1   AB      Living        SAB        TAB      Ectopic      Multiple      Live Births               Home Medications    Prior to Admission medications   Medication Sig Start Date End Date Taking? Authorizing Provider  amLODipine (NORVASC) 5 MG tablet Take 1 tablet (5 mg total) by mouth daily. 11/08/17   Raeford Razor, MD  cloNIDine (CATAPRES) 0.2 MG tablet Take 1 tablet (0.2 mg total) by mouth 2 (two) times daily. Patient not taking: Reported on 11/08/2017 04/04/15   Bethann Berkshire, MD  hydrochlorothiazide (HYDRODIURIL) 25 MG tablet Take 1 tablet (25 mg total) by mouth daily. Patient not taking: Reported on 11/08/2017 04/04/15   Bethann Berkshire, MD  ibuprofen (ADVIL,MOTRIN) 200 MG tablet Take 200 mg by mouth every 8 (eight) hours as needed for cramping.    [provider]  lisinopril (PRINIVIL,ZESTRIL) 40 MG tablet Take 1 tablet (40 mg total) by mouth daily. Patient not taking: Reported on 11/08/2017 09/28/14   Elliot Cousin, MD  lisinopril-hydrochlorothiazide (PRINZIDE,ZESTORETIC) 20-25 MG tablet Take 1 tablet by mouth daily. 11/08/17   Raeford Razor, MD  PARoxetine (PAXIL) 20 MG tablet Take 1 tablet (20 mg total) by mouth daily. Patient not taking: Reported on 11/08/2017 10/01/14  Hoy RegisterNewlin, Enobong, MD  potassium chloride 20 MEQ TBCR Take 10 mEq by mouth daily. Take with you blood pressure medicine hydrochlorothiazide. Patient not taking: Reported on 11/08/2017 09/28/14   Elliot CousinFisher, Denise, MD    Family History Family History  Problem Relation Age of Onset  . Hypertension Mother   . Hypertension Father   . Diabetes Father     Social History Social History   Tobacco Use  . Smoking status: Current Every Day Smoker    Packs/day: 0.25    Types: Cigarettes  . Smokeless tobacco: Never Used  Substance Use Topics  . Alcohol use: No  . Drug use: Never     Allergies   Patient has no known allergies.   Review of Systems Review of Systems  Constitutional: Negative for appetite change and fatigue.  HENT:  Negative for congestion, ear discharge and sinus pressure.   Eyes: Negative for discharge.  Respiratory: Negative for cough and shortness of breath.   Cardiovascular: Negative for chest pain.  Gastrointestinal: Negative for abdominal pain and diarrhea.  Genitourinary: Negative for frequency and hematuria.  Musculoskeletal: Negative for back pain.  Skin: Negative for rash.  Neurological: Positive for dizziness and weakness. Negative for focal weakness, seizures and headaches.  Psychiatric/Behavioral: Negative for hallucinations.     Physical Exam Updated Vital Signs BP (!) 191/96   Pulse 79   Temp 98.6 F (37 C) (Oral)   Resp (!) 23   Ht 5\' 6"  (1.676 m)   Wt 90.7 kg (200 lb)   LMP 11/08/2017   SpO2 96%   BMI 32.28 kg/m   Physical Exam  Constitutional: She is oriented to person, place, and time. She appears well-developed.  HENT:  Head: Normocephalic.  Eyes: Conjunctivae and EOM are normal. No scleral icterus.  Neck: Neck supple. No thyromegaly present.  Cardiovascular: Normal rate and regular rhythm. Exam reveals no gallop and no friction rub.  No murmur heard. Pulmonary/Chest: No stridor. She has no wheezes. She has no rales. She exhibits no tenderness.  Abdominal: She exhibits no distension. There is no tenderness. There is no rebound.  Musculoskeletal: Normal range of motion. She exhibits no edema.  Lymphadenopathy:    She has no cervical adenopathy.  Neurological: She is oriented to person, place, and time. She exhibits normal muscle tone. Coordination normal.  Skin: No rash noted. No erythema.  Psychiatric: She has a normal mood and affect. Her behavior is normal.     ED Treatments / Results  Labs (all labs ordered are listed, but only abnormal results are displayed) Labs Reviewed  BASIC METABOLIC PANEL - Abnormal; Notable for the following components:      Result Value   Potassium 2.8 (*)    Glucose, Bld 106 (*)    BUN 22 (*)    Creatinine, Ser 1.13 (*)     All other components within normal limits  CBC - Abnormal; Notable for the following components:   RBC 3.83 (*)    Hemoglobin 6.1 (*)    HCT 23.3 (*)    MCV 60.8 (*)    MCH 15.9 (*)    MCHC 26.2 (*)    RDW 19.5 (*)    All other components within normal limits  CBG MONITORING, ED - Abnormal; Notable for the following components:   Glucose-Capillary 102 (*)    All other components within normal limits  URINALYSIS, ROUTINE W REFLEX MICROSCOPIC  I-STAT BETA HCG BLOOD, ED ([facility], [facility], [facility] ONLY)  PREPARE RBC (CROSSMATCH)  TYPE AND  SCREEN    EKG None  Radiology No results found.  Procedures Procedures (including critical care time)  Medications Ordered in ED Medications  potassium chloride 10 mEq in 100 mL IVPB (10 mEq Intravenous New Bag/Given 11/14/17 2300)  0.9 %  sodium chloride infusion (Manually program via Guardrails IV Fluids) (has no administration in time range)  0.9 %  sodium chloride infusion (Manually program via Guardrails IV Fluids) (has no administration in time range)  potassium chloride SA (K-DUR,KLOR-CON) CR tablet 40 mEq (40 mEq Oral Given 11/14/17 2300)     Initial Impression / Assessment and Plan / ED Course  I have reviewed the triage vital signs and the nursing notes.  Pertinent labs & imaging results that were available during my care of the patient were reviewed by me and considered in my medical decision making (see chart for details).    CRITICAL CARE Performed by: Bethann Berkshire Total critical care time: 35 minutes Critical care time was exclusive of separately billable procedures and treating other patients. Critical care was necessary to treat or prevent imminent or life-threatening deterioration. Critical care was time spent personally by me on the following activities: development of treatment plan with patient and/or surrogate as well as nursing, discussions with consultants, evaluation of patient's response to treatment, examination of patient,  obtaining history from patient or surrogate, ordering and performing treatments and interventions, ordering and review of laboratory studies, ordering and review of radiographic studies, pulse oximetry and re-evaluation of patient's condition.   Patient was welcome control of blood pressure.  Also patient is anemic from heavy menses.  Patient also has hypokalemia.  She will be given potassium and transfused some hemoglobin and she will be admitted to medicine  Final Clinical Impressions(s) / ED Diagnoses   Final diagnoses:  Dizziness    ED Discharge Orders    None       Bethann Berkshire, MD 11/14/17 2304

## 2017-11-14 NOTE — ED Notes (Signed)
Date and time results received: 11/14/17 10:14 PM  (use smartphrase ".now" to insert current time)  Test: hemoglobin  Critical Value: 6.1  Name of Provider Notified: Zammit  Orders Received? Or Actions Taken?: n/a

## 2017-11-14 NOTE — ED Triage Notes (Addendum)
Pt reports feeling "like she is going to pass out", dizziness and feeling "like something bad was going to happen."  Pt reports this has been going on for 6 days ago when she was seen here for HTN and started on BP meds. Pt tearful in triage.

## 2017-11-14 NOTE — H&P (Signed)
History and Physical    [name] UJW:[id number] DOB: [birth year] DOA: 11/14/2017  PCP: Patient, No Pcp Per   Patient coming from: Home  Chief Complaint: Dizziness  HPI: [name] is a 37 y.o. female with medical history significant for hypertension with medication noncompliance due to financial issues, prior TIA, grade 2 diastolic dysfunction, tobacco abuse, microcytic anemia, and menorrhagia who presented to the ED with worsening dizziness as well as presyncope.  She felt as though she was going to "pass out" and has been dealing with very heavy menstrual periods which she says is fairly common for her.  She was seen in the ED approximately 1 week ago at which time she was noted to have similar symptoms and was noted to have extremely elevated blood pressure readings.  She was prescribed lisinopril/HCTZ as well as amlodipine after not having taken any of her blood pressure medications for several months.  Says she has been taking these medications as prescribed and yet her blood pressures are severely elevated.  She has never had a gynecology evaluation for her menorrhagia in the past. She denies any chest pain, shortness of breath, palpitations, abdominal pain, or diarrhea.  She has had some mild nausea and vomiting.  She denies any hematemesis, blood in her stools, or dark tarry stools.   ED Course: Signs demonstrate extremely elevated blood pressure readings with systolics in the 200 range.  She has not received anything in the ED for blood pressure control as of yet.  She is noted to have hemoglobin of 6.1 and is noted to have a baseline of 11.  She was actually noted to have hemoglobin level of 7.1 when she was recently seen in ED approximately a week ago.  She has been ordered 2 units PRBC transfusion.  She has also been ordered potassium supplementation.  Review of Systems: All others reviewed and otherwise negative.  Past Medical History:  Diagnosis Date  . Diastolic dysfunction  09/28/2014   Grade 2. Mod LVH. EF 60%  . Hypertension   . Hypertensive urgency 09/26/2014  . 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 MRI brain and C-spine unremarkable  . Microcytic anemia 09/27/2014   Heavy menstrual periods  . Preeclampsia     x3    Past Surgical History:  Procedure Laterality Date  . c-sections       reports that she has been smoking cigarettes.  She has been smoking about 0.25 packs per day. She has never used smokeless tobacco. She reports that she does not drink alcohol or use drugs.  No Known Allergies  Family History  Problem Relation Age of Onset  . Hypertension Mother   . Hypertension Father   . Diabetes Father     Prior to Admission medications   Medication Sig Start Date End Date Taking? Authorizing Provider  amLODipine (NORVASC) 5 MG tablet Take 1 tablet (5 mg total) by mouth daily. 11/08/17  Yes Raeford Razor, MD  lisinopril-hydrochlorothiazide (PRINZIDE,ZESTORETIC) 20-25 MG tablet Take 1 tablet by mouth daily. 11/08/17  Yes Raeford Razor, MD    Physical Exam: Vitals:   11/14/17 2114 11/14/17 2204 11/14/17 2205 11/14/17 2206  BP: (!) 239/118 (!) 191/96    Pulse: 95  77 79  Resp: (!) 22   (!) 23  Temp: 98.6 F (37 C)     TempSrc: Oral     SpO2: 96%  98% 96%  Weight:      Height:  Constitutional: Mild to moderate distress Vitals:   11/14/17 2114 11/14/17 2204 11/14/17 2205 11/14/17 2206  BP: (!) 239/118 (!) 191/96    Pulse: 95  77 79  Resp: (!) 22   (!) 23  Temp: 98.6 F (37 C)     TempSrc: Oral     SpO2: 96%  98% 96%  Weight:      Height:       Eyes: lids and conjunctivae normal ENMT: Mucous membranes are moist.  Neck: normal, supple Respiratory: clear to auscultation bilaterally. Normal respiratory effort. No accessory muscle use.  Cardiovascular: Regular rate and rhythm, no murmurs. No extremity edema. Abdomen: no tenderness, no distention. Bowel sounds positive.  Musculoskeletal:  No joint  deformity upper and lower extremities.   Skin: no rashes, lesions, ulcers.   Labs on Admission: I have personally reviewed following labs and imaging studies  CBC: Recent Labs  Lab 11/08/17 1250 11/14/17 2136  WBC 7.3 7.0  NEUTROABS 5.9  --   HGB 7.1* 6.1*  HCT 27.1* 23.3*  MCV 61.5* 60.8*  PLT 357 328   Basic Metabolic Panel: Recent Labs  Lab 11/08/17 1250 11/14/17 2136  NA 139 141  K 3.4* 2.8*  CL 107 105  CO2 22 26  GLUCOSE 91 106*  BUN 15 22*  CREATININE 1.05* 1.13*  CALCIUM 9.8 9.5   GFR: Estimated Creatinine Clearance: 78.1 mL/min (A) (by C-G formula based on SCr of 1.13 mg/dL (H)). Liver Function Tests: Recent Labs  Lab 11/08/17 1250  AST 21  ALT 14  ALKPHOS 96  BILITOT 0.4  PROT 9.4*  ALBUMIN 4.5   No results for input(s): LIPASE, AMYLASE in the last 168 hours. No results for input(s): AMMONIA in the last 168 hours. Coagulation Profile: No results for input(s): INR, PROTIME in the last 168 hours. Cardiac Enzymes: No results for input(s): CKTOTAL, CKMB, CKMBINDEX, TROPONINI in the last 168 hours. BNP (last 3 results) No results for input(s): PROBNP in the last 8760 hours. HbA1C: No results for input(s): HGBA1C in the last 72 hours. CBG: Recent Labs  Lab 11/14/17 2210  GLUCAP 102*   Lipid Profile: No results for input(s): CHOL, HDL, LDLCALC, TRIG, CHOLHDL, LDLDIRECT in the last 72 hours. Thyroid Function Tests: No results for input(s): TSH, T4TOTAL, FREET4, T3FREE, THYROIDAB in the last 72 hours. Anemia Panel: No results for input(s): VITAMINB12, FOLATE, FERRITIN, TIBC, IRON, RETICCTPCT in the last 72 hours. Urine analysis:    Component Value Date/Time   COLORURINE COLORLESS (A) 11/08/2017 1243   APPEARANCEUR CLEAR 11/08/2017 1243   LABSPEC 1.004 (L) 11/08/2017 1243   PHURINE 6.0 11/08/2017 1243   GLUCOSEU NEGATIVE 11/08/2017 1243   HGBUR MODERATE (A) 11/08/2017 1243   BILIRUBINUR NEGATIVE 11/08/2017 1243   KETONESUR NEGATIVE  11/08/2017 1243   PROTEINUR NEGATIVE 11/08/2017 1243   UROBILINOGEN 0.2 03/12/2008 1235   NITRITE NEGATIVE 11/08/2017 1243   LEUKOCYTESUR NEGATIVE 11/08/2017 1243    Radiological Exams on Admission: No results found.   Assessment/Plan Principal Problem:   Acute on chronic blood loss anemia Active Problems:   Hypertensive urgency   TIA (transient ischemic attack)   Tobacco abuse   Diastolic dysfunction   Anxiety and depression   Menorrhagia   Hypokalemia    1. Acute on chronic blood loss anemia secondary to menorrhagia.  Order 2 unit PRBC transfusion and consultation to gynecology for further evaluation of menorrhagia.  She is noted to have microcytic anemia secondary to iron deficiency previously and I will recheck iron  levels prior to transfusion.  Patient will likely require Feraheme during this admission with repeat oral iron supplementation on discharge. 2. Hypertensive urgency.  This is secondary to her inability to afford medications.  She was previously on clonidine in addition to her other medications as prescribed and I will restart this and assess response.  We will also order hydralazine IV pushes to see if control can be achieved.  Placed in stepdown unit in case Cardene drip is required. 3. Hypokalemia. Supplementation as ordered in ED with repeat AM labs and Mg. 4. Tobacco abuse.  Smoking cessation counseling. 5. Chronic diastolic CHF.  Currently appears euvolemic.   DVT prophylaxis: SCDs Code Status: Full Family Communication: Multiple family members at bedside Disposition Plan: Admit for transfusion and blood pressure control Consults called: Gynecology Admission status: Inpatient, stepdown unit   Ivalee Strauser Hoover BrunetteD Doloros Kwolek DO [facility] Pager [phone]  If 7PM-7AM, please contact night-coverage [email or web address] Password [email or web address]  11/14/2017, 11:29 PM

## 2017-11-15 ENCOUNTER — Inpatient Hospital Stay (HOSPITAL_COMMUNITY): Payer: Self-pay

## 2017-11-15 ENCOUNTER — Encounter (HOSPITAL_COMMUNITY): Payer: Self-pay

## 2017-11-15 DIAGNOSIS — D500 Iron deficiency anemia secondary to blood loss (chronic): Secondary | ICD-10-CM

## 2017-11-15 DIAGNOSIS — F419 Anxiety disorder, unspecified: Secondary | ICD-10-CM

## 2017-11-15 DIAGNOSIS — I5189 Other ill-defined heart diseases: Secondary | ICD-10-CM

## 2017-11-15 DIAGNOSIS — F329 Major depressive disorder, single episode, unspecified: Secondary | ICD-10-CM

## 2017-11-15 DIAGNOSIS — I10 Essential (primary) hypertension: Secondary | ICD-10-CM

## 2017-11-15 LAB — BASIC METABOLIC PANEL
Anion gap: 7 (ref 5–15)
BUN: 18 mg/dL (ref 6–20)
CHLORIDE: 105 mmol/L (ref 101–111)
CO2: 26 mmol/L (ref 22–32)
CREATININE: 0.98 mg/dL (ref 0.44–1.00)
Calcium: 8.9 mg/dL (ref 8.9–10.3)
GFR calc Af Amer: >60 mL/min (ref >60)
GFR calc non Af Amer: >60 mL/min (ref >60)
GLUCOSE: 98 mg/dL (ref 65–99)
Potassium: 3.2 mmol/L — ABNORMAL LOW (ref 3.5–5.1)
Sodium: 138 mmol/L (ref 135–145)

## 2017-11-15 LAB — MAGNESIUM: Magnesium: 2.3 mg/dL (ref 1.7–2.4)

## 2017-11-15 LAB — RAPID URINE DRUG SCREEN, HOSP PERFORMED
Amphetamines: NOT DETECTED
Benzodiazepines: NOT DETECTED
COCAINE: NOT DETECTED
Opiates: NOT DETECTED
TETRAHYDROCANNABINOL: NOT DETECTED

## 2017-11-15 LAB — CBC
HCT: 21.5 % — ABNORMAL LOW (ref 36.0–46.0)
Hemoglobin: 5.6 g/dL — CL (ref 12.0–15.0)
MCH: 16 pg — AB (ref 26.0–34.0)
MCHC: 26 g/dL — AB (ref 30.0–36.0)
MCV: 61.4 fL — AB (ref 78.0–100.0)
PLATELETS: 290 10*3/uL (ref 150–400)
RBC: 3.5 MIL/uL — ABNORMAL LOW (ref 3.87–5.11)
RDW: 19.6 % — AB (ref 11.5–15.5)
WBC: 5.2 10*3/uL (ref 4.0–10.5)

## 2017-11-15 LAB — URINALYSIS, ROUTINE W REFLEX MICROSCOPIC
Bilirubin Urine: NEGATIVE
Glucose, UA: NEGATIVE mg/dL
HGB URINE DIPSTICK: NEGATIVE
KETONES UR: NEGATIVE mg/dL
Nitrite: NEGATIVE
PROTEIN: NEGATIVE mg/dL
Specific Gravity, Urine: 1.012 (ref 1.005–1.030)
pH: 6 (ref 5.0–8.0)

## 2017-11-15 LAB — RETICULOCYTES
RBC.: 3.86 MIL/uL — ABNORMAL LOW (ref 3.87–5.11)
RETIC COUNT ABSOLUTE: 30.9 10*3/uL (ref 19.0–186.0)
Retic Ct Pct: 0.8 % (ref 0.4–3.1)

## 2017-11-15 LAB — VITAMIN B12: VITAMIN B 12: 295 pg/mL (ref 180–914)

## 2017-11-15 LAB — IRON AND TIBC
Iron: 42 ug/dL (ref 28–170)
SATURATION RATIOS: 7 % — AB (ref 10.4–31.8)
TIBC: 561 ug/dL — ABNORMAL HIGH (ref 250–450)
UIBC: 519 ug/dL

## 2017-11-15 LAB — FOLATE: Folate: 5.3 ng/mL — ABNORMAL LOW (ref >5.9)

## 2017-11-15 LAB — MRSA PCR SCREENING: MRSA by PCR: NEGATIVE

## 2017-11-15 LAB — FERRITIN: Ferritin: 4 ng/mL — ABNORMAL LOW (ref 11–307)

## 2017-11-15 MED ORDER — MEDROXYPROGESTERONE ACETATE 400 MG/ML IM SUSP
400.0000 mg | Freq: Once | INTRAMUSCULAR | Status: DC
  Filled 2017-11-15: qty 1

## 2017-11-15 MED ORDER — BOOST / RESOURCE BREEZE PO LIQD CUSTOM
1.0000 | Freq: Three times a day (TID) | ORAL | Status: DC
  Administered 2017-11-15: 1 via ORAL

## 2017-11-15 MED ORDER — FERROUS SULFATE 325 (65 FE) MG PO TABS
325.0000 mg | ORAL_TABLET | Freq: Two times a day (BID) | ORAL | Status: DC
  Administered 2017-11-15 – 2017-11-16 (×3): 325 mg via ORAL
  Filled 2017-11-15 (×3): qty 1

## 2017-11-15 MED ORDER — SODIUM CHLORIDE 0.9 % IV SOLN
510.0000 mg | Freq: Once | INTRAVENOUS | Status: AC
  Administered 2017-11-15: 510 mg via INTRAVENOUS
  Filled 2017-11-15: qty 17

## 2017-11-15 MED ORDER — POTASSIUM CHLORIDE CRYS ER 20 MEQ PO TBCR
40.0000 meq | EXTENDED_RELEASE_TABLET | Freq: Once | ORAL | Status: AC
  Administered 2017-11-15: 40 meq via ORAL
  Filled 2017-11-15: qty 2

## 2017-11-15 MED ORDER — HYDROCHLOROTHIAZIDE 25 MG PO TABS
25.0000 mg | ORAL_TABLET | Freq: Every day | ORAL | Status: DC
  Administered 2017-11-15 – 2017-11-16 (×2): 25 mg via ORAL
  Filled 2017-11-15 (×2): qty 1

## 2017-11-15 MED ORDER — ESTROGENS CONJUGATED 25 MG IJ SOLR
25.0000 mg | Freq: Four times a day (QID) | INTRAMUSCULAR | Status: DC
  Filled 2017-11-15 (×3): qty 25

## 2017-11-15 MED ORDER — ESTROGENS CONJUGATED 25 MG IJ SOLR
25.0000 mg | Freq: Four times a day (QID) | INTRAMUSCULAR | Status: AC
Start: 2017-11-15 — End: 2017-11-15
  Administered 2017-11-15 (×2): 25 mg via INTRAVENOUS
  Filled 2017-11-15 (×3): qty 25

## 2017-11-15 MED ORDER — LISINOPRIL 10 MG PO TABS
20.0000 mg | ORAL_TABLET | Freq: Every day | ORAL | Status: DC
Start: 2017-11-15 — End: 2017-11-16
  Administered 2017-11-15 – 2017-11-16 (×2): 20 mg via ORAL
  Filled 2017-11-15 (×2): qty 2

## 2017-11-15 MED ORDER — DIPHENHYDRAMINE HCL 25 MG PO CAPS
50.0000 mg | ORAL_CAPSULE | Freq: Every evening | ORAL | Status: DC | PRN
  Administered 2017-11-15: 50 mg via ORAL
  Filled 2017-11-15: qty 2

## 2017-11-15 MED ORDER — MEGESTROL ACETATE 40 MG PO TABS
120.0000 mg | ORAL_TABLET | Freq: Every day | ORAL | Status: DC
  Administered 2017-11-15 – 2017-11-16 (×2): 120 mg via ORAL
  Filled 2017-11-15 (×4): qty 3

## 2017-11-15 NOTE — Progress Notes (Signed)
CRITICAL VALUE ALERT  Critical Value:  Hemoglobin 5.6  Date & Time Notied:  11/15/17 @ 0650  Provider Notified: Dr. Sherryll BurgerShah  Orders Received/Actions taken: n/a  Adv Dr. Sherryll BurgerShah in page that the blood bank stated the blood had just arrived from [place] and would be about 45 minutes until it was ready to give. Will pass on to day shift.

## 2017-11-15 NOTE — Progress Notes (Signed)
Patient arrived to floor via wheelchair. Patient is resting in bed with no complaints. Blood is currently infusing.

## 2017-11-15 NOTE — Progress Notes (Signed)
GYN service:   [place] Pelvic Complete With Transvaginal  Result Date: 11/15/2017 CLINICAL DATA:  Menorrhagia and anemia. Heavy periods for 5 years. History of prior C-section. LMP 11/08/2017. EXAM: TRANSABDOMINAL AND TRANSVAGINAL ULTRASOUND OF PELVIS TECHNIQUE: Both transabdominal and transvaginal ultrasound examinations of the pelvis were performed. Transabdominal technique was performed for global imaging of the pelvis including uterus, ovaries, adnexal regions, and pelvic cul-de-sac. It was necessary to proceed with endovaginal exam following the transabdominal exam to visualize the endometrium, ovaries. COMPARISON:  03/12/2008 FINDINGS: Uterus Measurements: 10.3 x 5.4 x 6.3 centimeters. Multiple nabothian cysts are identified. No suspicious solid mass. C-section scar identified. Endometrium Thickness: 8.4 millimeters.  No focal abnormality visualized. Right ovary Measurements: 4.0 x 2.7 x 2.5 centimeters. Normal appearance/no adnexal mass. Left ovary Measurements: 3.3 x 1.5 x 2.2 centimeters. Normal appearance/no adnexal mass. Other findings No abnormal free fluid. Study quality is degraded by patient body habitus. IMPRESSION: 1. Normal appearance of the endometrial stripe. If bleeding remains unresponsive to hormonal or medical therapy, sonohysterogram should be considered for focal lesion work-up. (Ref: Radiological Reasoning: Algorithmic Workup of Abnormal Vaginal Bleeding with Endovaginal Sonography and Sonohysterography. AJR 2008; 161:[id number]) 2. Normal appearance of both ovaries. Electronically Signed   By: Norva PavlovElizabeth  Brown M.D.   On: 11/15/2017 15:45   CBC Latest Ref Rng & Units 11/15/2017 11/14/2017 11/08/2017  WBC 4.0 - 10.5 K/uL 5.2 7.0 7.3  Hemoglobin 12.0 - 15.0 g/dL 5.6(LL) 6.1(LL) 7.1(L)  Hematocrit 36.0 - 46.0 % 21.5(L) 23.3(L) 27.1(L)  Platelets 150 - 400 K/uL 290 328 357    Assessment: >Bleeding has resolved on IV premarin and a dose of oral megestrol.   Depo provera 400 mg IM ordered to  be given now to overcome any issues of outpatient compliance which has been a problem with her BP management Sonogram is normal, no endometrial or uterine pathology  >She has received PRBC and I ordered IV iron, feraheme, to provide on going iron for this patient who is severely microcytic but bone marrow appears quite active with very high RDW and retic counts.  She should respond well as long as her bleeding is not significant.  Oral iron can also be ordered for outpatient therapy but not imperative given her IV dosing.    If she remains severely nicrocytic I can order another dose of IV iron as an outpatient. Hemoglobin is pending tomorrow am  Re[facility] 3 weeks to assess her response to her bleeding management and also her blood counts  Otherwise as far as I am concerned pt may be discharged tomorrow am as planned   Discussed with Dr Tat by phone  Lazaro ArmsLuther H Eure, MD 11/15/2017 6:44 PM

## 2017-11-15 NOTE — Progress Notes (Signed)
This Rn [facility]- stated pts blood has to be [place] and [place]. Blood probably won't be here until 5 or 6am. Pt c/o insomnia- Dr. Sherryll BurgerShah paged and made aware about blood as well as asking for something for sleep for pt.  New order for Benadryl. Will given to pt and continue to monitor.

## 2017-11-15 NOTE — Progress Notes (Signed)
PROGRESS NOTE  [name] WJX:[id number] DOB: 01/24/1981 DOA: 11/14/2017 PCP: Patient, No Pcp Per  Brief History:  37 year old female with a history of hypertension, TIA, tobacco abuse, menorrhagia week history of dizziness and syncopal type symptoms associated with generalized weakness and malaise.  Patient was seen in the emergency department on 11/08/2017 with similar symptoms.  CT of the brain was negative at that time.  The patient was discharged home with lisinopril/HCTZ and amlodipine.  The patient had been poorly compliant with her antihypertensive medications secondary to financial issues.  She states that she has been taking her lisinopril HCTZ and amlodipine since her ED visit on 11/08/2017.  However, she continued to have blood pressure up to 239/118 at the time presentation to the emergency department.  Patient states that the first day her last menstrual period was November 04, 2017.  The patient denied any chest pain, shortness breath, palpitations, nausea, vomiting, diarrhea.  She has not had any hematochezia, melena, hemoptysis, hematemesis.  Upon presentation, the patient was noted to have hemoglobin 6.1.  2 units PRBC were ordered.  Assessment/Plan: Chronic blood loss anemia/symptomatic anemia -Secondary to menorrhagia -Check FOBT -Follow-up iron studies -2 units PRBC ordered -FeraHeme x 1 -UA neg for hematuria -UDS  Menorrhagia -GYN consulted -IV Premarin and Megace started  Iron deficiency anemia -due to chronic blood loss -FeraHeme x 1 -start ferrous sulfate  Malignant hypertension -In part due to poor compliance with medications -Discontinue clonidine due to the patient's history of poor compliance and risk of rebound hypertension -Continue amlodipine and lisinopril/HCTZ  Hypokalemia -Replete -Check magnesium  Tobacco abuse I have discussed tobacco cessation with the patient.  I have counseled the patient regarding the negative impacts of continued  tobacco use including but not limited to lung cancer, COPD, and cardiovascular disease.  I have discussed alternatives to tobacco and modalities that may help facilitate tobacco cessation including but not limited to biofeedback, hypnosis, and medications.  Total time spent with tobacco counseling was 4 minutes.   Disposition Plan:   Home 6/22 if stable Family Communication:   No Family at bedside  Consultants:  GYN--Dr. Despina Hidden  Code Status:  FULL  DVT Prophylaxis:  SCDs   Procedures: As Listed in Progress Note Above  Antibiotics: None    Subjective: Patient continues to have some dizziness.  She denies any chest pain, short breath, sicca B, headache, chest pain, shortness breath, nausea, vomiting, diarrhea, abdominal pain.  There is no dysuria, hematuria, hematochezia, melena.  There is no hemoptysis or hematemesis.  Objective: Vitals:   11/15/17 0600 11/15/17 0700 11/15/17 0750 11/15/17 0800  BP: 132/80 138/81 (!) 145/81 (!) 155/78  Pulse: 60 (!) 58 (!) 56 61  Resp: 20 20 20 20   Temp:      TempSrc:      SpO2: 100% 100% 99% 99%  Weight:      Height:        Intake/Output Summary (Last 24 hours) at 11/15/2017 0842 Last data filed at 11/15/2017 0616 Gross per 24 hour  Intake 240 ml  Output 400 ml  Net -160 ml   Weight change:  Exam:   General:  Pt is alert, follows commands appropriately, not in acute distress  HEENT: No icterus, No thrush, No neck mass, [place]/AT  Cardiovascular: RRR, S1/S2, no rubs, no gallops  Respiratory: CTA bilaterally, no wheezing, no crackles, no rhonchi  Abdomen: Soft/+BS, non tender, non distended, no guarding  Extremities: No  edema, No lymphangitis, No petechiae, No rashes, no synovitis   Data Reviewed: I have personally reviewed following labs and imaging studies Basic Metabolic Panel: Recent Labs  Lab 11/08/17 1250 11/14/17 2136 11/15/17 0540  NA 139 141 138  K 3.4* 2.8* 3.2*  CL 107 105 105  CO2 22 26 26   GLUCOSE 91 106* 98   BUN 15 22* 18  CREATININE 1.05* 1.13* 0.98  CALCIUM 9.8 9.5 8.9  MG  --  2.3  --    Liver Function Tests: Recent Labs  Lab 11/08/17 1250  AST 21  ALT 14  ALKPHOS 96  BILITOT 0.4  PROT 9.4*  ALBUMIN 4.5   No results for input(s): LIPASE, AMYLASE in the last 168 hours. No results for input(s): AMMONIA in the last 168 hours. Coagulation Profile: No results for input(s): INR, PROTIME in the last 168 hours. CBC: Recent Labs  Lab 11/08/17 1250 11/14/17 2136 11/15/17 0540  WBC 7.3 7.0 5.2  NEUTROABS 5.9  --   --   HGB 7.1* 6.1* 5.6*  HCT 27.1* 23.3* 21.5*  MCV 61.5* 60.8* 61.4*  PLT 357 328 290   Cardiac Enzymes: No results for input(s): CKTOTAL, CKMB, CKMBINDEX, TROPONINI in the last 168 hours. BNP: Invalid input(s): POCBNP CBG: Recent Labs  Lab 11/14/17 2210  GLUCAP 102*   HbA1C: No results for input(s): HGBA1C in the last 72 hours. Urine analysis:    Component Value Date/Time   COLORURINE STRAW (A) 11/15/2017 0616   APPEARANCEUR HAZY (A) 11/15/2017 0616   LABSPEC 1.012 11/15/2017 0616   PHURINE 6.0 11/15/2017 0616   GLUCOSEU NEGATIVE 11/15/2017 0616   HGBUR NEGATIVE 11/15/2017 0616   BILIRUBINUR NEGATIVE 11/15/2017 0616   KETONESUR NEGATIVE 11/15/2017 0616   PROTEINUR NEGATIVE 11/15/2017 0616   UROBILINOGEN 0.2 03/12/2008 1235   NITRITE NEGATIVE 11/15/2017 0616   LEUKOCYTESUR SMALL (A) 11/15/2017 0616   Sepsis Labs: @LABRCNTIP (procalcitonin:4,lacticidven:4) ) Recent Results (from the past 240 hour(s))  MRSA PCR Screening     Status: None   Collection Time: 11/15/17 12:51 AM  Result Value Ref Range Status   MRSA by PCR NEGATIVE NEGATIVE Final    Comment:        The GeneXpert MRSA Assay (FDA approved for NASAL specimens only), is one component of a comprehensive MRSA colonization surveillance program. It is not intended to diagnose MRSA infection nor to guide or monitor treatment for MRSA infections. Performed at [facility], [address] [place], [place] [address]      Scheduled Meds: . amLODipine  5 mg Oral Daily  . conjugated estrogens  25 mg Intravenous Q6H  . feeding supplement  1 Container Oral TID BM  . lisinopril  20 mg Oral Daily   And  . hydrochlorothiazide  25 mg Oral Daily  . megestrol  120 mg Oral Daily  . sodium chloride flush  3 mL Intravenous Q12H   Continuous Infusions: . sodium chloride    . ferumoxytol      Procedures/Studies: Ct Head Wo Contrast  Result Date: 11/08/2017 CLINICAL DATA:  Dizziness and headache for 3 days. EXAM: CT HEAD WITHOUT CONTRAST TECHNIQUE: Contiguous axial images were obtained from the base of the skull through the vertex without intravenous contrast. COMPARISON:  09/27/2014 FINDINGS: Brain: No evidence of acute infarction, hemorrhage, hydrocephalus, extra-axial collection or mass lesion/mass effect. Vascular: No hyperdense vessel or unexpected calcification. Skull: Normal. Negative for fracture or focal lesion. Sinuses/Orbits: No acute finding. Other: None. IMPRESSION: Normal exam. Electronically Signed  By: Signa Kell M.D.   On: 11/08/2017 13:22    Catarina Hartshorn, DO  [facility] Pager [phone]  If 7PM-7AM, please contact night-coverage [email or web address] Password [email or web address] 11/15/2017, 8:42 AM   LOS: 1 day

## 2017-11-15 NOTE — Progress Notes (Signed)
Initial Nutrition Assessment  DOCUMENTATION CODES:   Obesity unspecified  INTERVENTION:   Monitor po intake to see if appetite has improved.    If appetite has not shown to be improved after lunch 6/21, order Ensure Enlive po BID, each supplement provides 350 kcal and 20 grams of protein   NUTRITION DIAGNOSIS:   Inadequate oral intake related to nausea as evidenced by per patient/family report, energy intake < or equal to 50% for > or equal to 5 days.  GOAL:   Patient will meet greater than or equal to 90% of their needs   MONITOR:   PO intake, Supplement acceptance, Weight trends  REASON FOR ASSESSMENT:   Malnutrition Screening Tool   ASSESSMENT:  37 y/o female with Hx of HTN, TIA, tobacco abuse, menorrhagia.  Admitted for worsening dizziness.  MST score of 2.    Pt reports having a poor appetite for the past 3 weeks, eating 50% of normal intake, due to nausea.  She reports eating only one meal/day: half a sandwich, jello fruit cup, and sweet tea, juice, and ginger ale for her nausea.  Pt does not take supplements or vitamins at home.  Pt says she's lost 6 lbs in 3 weeks going from 204 to 198 lbs.  This is <3% and is not clinically significant for malnutrition criteria.  A NFPE also showed no signs of malnutrition.    Currently pt was just switched from a clear diet to heart healthy, so she hasn't received real food yet since admission, but states that she is "starving" and hopefully lunch will go well.  Will continue to monitor intake.  Pt rejected Boost supplement this morning, seemingly due to being unaware of what it was, but when asked was open to taking a supplement should she be given one.    Labs reviewed: K: 3.2 Recent Labs  Lab 11/08/17 1250 11/14/17 2136 11/15/17 0540  NA 139 141 138  K 3.4* 2.8* 3.2*  CL 107 105 105  CO2 22 26 26   BUN 15 22* 18  CREATININE 1.05* 1.13* 0.98  CALCIUM 9.8 9.5 8.9  MG  --  2.3  --   GLUCOSE 91 106* 98    Medications: Iron, megace, saline, feraheme, apresoline   Past Medical History:  Diagnosis Date  . Diastolic dysfunction 09/28/2014   Grade 2. Mod LVH. EF 60%  . Hypertension   . Hypertensive urgency 09/26/2014  . 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 MRI brain and C-spine unremarkable  . Microcytic anemia 09/27/2014   Heavy menstrual periods  . Preeclampsia     x3   NUTRITION - FOCUSED PHYSICAL EXAM:    Most Recent Value  Orbital Region  No depletion  Upper Arm Region  No depletion  Buccal Region  No depletion  Temple Region  No depletion  Clavicle Bone Region  No depletion  Clavicle and Acromion Bone Region  No depletion  Dorsal Hand  No depletion  Anterior Thigh Region  No depletion  Posterior Calf Region  No depletion  Skin  Reviewed  Nails  Reviewed       Diet Order:   Diet Order           Diet Heart Room service appropriate? Yes; Fluid consistency: Thin  Diet effective now         EDUCATION NEEDS:   No education needs have been identified at this time  Skin:  Skin Assessment: Reviewed RN Assessment  Last BM:  6/20  Height:   Ht Readings from Last 1 Encounters:  11/15/17 5\' 6"  (1.676 m)   Weight:   Wt Readings from Last 1 Encounters:  11/15/17 198 lb 6.6 oz (90 kg)   Ideal Body Weight:  59 kg  BMI:  Body mass index is 32.02 kg/m.  Estimated Nutritional Needs:   Kcal:  1740-1940 kcal (26-29 kcal/kg adj. bw)  Protein:  80-100 g (1.2-1.5 g/kg adj. bw)  Fluid:  >2000 mL (33mL/kg adj. bw)  Merrily Brittle Student Dietitian 11/15/2017  12:09 PM

## 2017-11-15 NOTE — Progress Notes (Signed)
Late entry from [id number]- Lab called this RN and stated the blood had just left [name] to come to [name]. She stated when the blood gets here she has to do some things to check it and will call up to the floor when the blood is ready.

## 2017-11-16 DIAGNOSIS — Z720 Tobacco use: Secondary | ICD-10-CM

## 2017-11-16 DIAGNOSIS — E876 Hypokalemia: Secondary | ICD-10-CM

## 2017-11-16 DIAGNOSIS — N920 Excessive and frequent menstruation with regular cycle: Secondary | ICD-10-CM

## 2017-11-16 DIAGNOSIS — D500 Iron deficiency anemia secondary to blood loss (chronic): Secondary | ICD-10-CM

## 2017-11-16 LAB — TYPE AND SCREEN
ABO/RH(D): A POS
Antibody Screen: NEGATIVE
Donor AG Type: NEGATIVE
Donor AG Type: NEGATIVE
UNIT DIVISION: 0
UNIT DIVISION: 0

## 2017-11-16 LAB — BASIC METABOLIC PANEL
Anion gap: 7 (ref 5–15)
BUN: 18 mg/dL (ref 6–20)
CO2: 27 mmol/L (ref 22–32)
CREATININE: 0.84 mg/dL (ref 0.44–1.00)
Calcium: 9.1 mg/dL (ref 8.9–10.3)
Chloride: 104 mmol/L (ref 101–111)
Glucose, Bld: 94 mg/dL (ref 65–99)
Potassium: 3.3 mmol/L — ABNORMAL LOW (ref 3.5–5.1)
SODIUM: 138 mmol/L (ref 135–145)

## 2017-11-16 LAB — CBC
HCT: 28 % — ABNORMAL LOW (ref 36.0–46.0)
Hemoglobin: 7.8 g/dL — ABNORMAL LOW (ref 12.0–15.0)
MCH: 18.2 pg — ABNORMAL LOW (ref 26.0–34.0)
MCHC: 27.9 g/dL — AB (ref 30.0–36.0)
MCV: 65.3 fL — ABNORMAL LOW (ref 78.0–100.0)
PLATELETS: 281 10*3/uL (ref 150–400)
RBC: 4.29 MIL/uL (ref 3.87–5.11)
RDW: 22 % — AB (ref 11.5–15.5)
WBC: 7.1 10*3/uL (ref 4.0–10.5)

## 2017-11-16 LAB — MAGNESIUM: MAGNESIUM: 2 mg/dL (ref 1.7–2.4)

## 2017-11-16 LAB — BPAM RBC
Blood Product Expiration Date: 201907012359
Blood Product Expiration Date: 201907152359
ISSUE DATE / TIME: 201906210911
ISSUE DATE / TIME: 201906211524
UNIT TYPE AND RH: 6200
Unit Type and Rh: 6200

## 2017-11-16 LAB — HIV ANTIBODY (ROUTINE TESTING W REFLEX): HIV SCREEN 4TH GENERATION: NONREACTIVE

## 2017-11-16 MED ORDER — FERROUS SULFATE 325 (65 FE) MG PO TABS: 325.0000 mg | ORAL_TABLET | Freq: Two times a day (BID) | ORAL | 3 refills | Status: DC

## 2017-11-16 MED ORDER — MEDROXYPROGESTERONE ACETATE 400 MG/ML IM SUSP
400.0000 mg | Freq: Once | INTRAMUSCULAR | Status: AC
  Administered 2017-11-16: 400 mg via INTRAMUSCULAR
  Filled 2017-11-16: qty 1

## 2017-11-16 MED ORDER — AMLODIPINE BESYLATE 5 MG PO TABS
5.0000 mg | ORAL_TABLET | Freq: Once | ORAL | Status: AC
  Administered 2017-11-16: 5 mg via ORAL
  Filled 2017-11-16: qty 1

## 2017-11-16 MED ORDER — AMLODIPINE BESYLATE 5 MG PO TABS: 10.0000 mg | ORAL_TABLET | Freq: Every day | ORAL | Status: DC

## 2017-11-16 MED ORDER — AMLODIPINE BESYLATE 10 MG PO TABS: 10.0000 mg | ORAL_TABLET | Freq: Every day | ORAL | 2 refills | Status: DC

## 2017-11-16 MED ORDER — FOLIC ACID 1 MG PO TABS: 1.0000 mg | ORAL_TABLET | Freq: Every day | ORAL | 1 refills | Status: DC

## 2017-11-16 MED ORDER — FOLIC ACID 1 MG PO TABS
1.0000 mg | ORAL_TABLET | Freq: Every day | ORAL | Status: DC
  Administered 2017-11-16: 1 mg via ORAL
  Filled 2017-11-16: qty 1

## 2017-11-16 MED ORDER — LISINOPRIL-HYDROCHLOROTHIAZIDE 20-25 MG PO TABS: 1.0000 | ORAL_TABLET | Freq: Every day | ORAL | 2 refills | Status: DC

## 2017-11-16 MED ORDER — POTASSIUM CHLORIDE CRYS ER 20 MEQ PO TBCR
40.0000 meq | EXTENDED_RELEASE_TABLET | Freq: Once | ORAL | Status: AC
  Administered 2017-11-16: 40 meq via ORAL
  Filled 2017-11-16: qty 2

## 2017-11-16 NOTE — Discharge Summary (Signed)
Physician Discharge Summary  [name]:[id number] DOB: 1980/09/03 DOA: 11/14/2017  PCP: Patient, No Pcp Per  Admit date: 11/14/2017 Discharge date: 11/16/2017  Admitted From: Home Disposition:  Home   Recommendations for Outpatient Follow-up:  1. Follow up with PCP in 1-2 weeks 2. Please obtain BMP/CBC in one week    Discharge Condition: Stable CODE STATUS: FULL Diet recommendation: Heart Healthy   Brief/Interim Summary: 37 year old female with a history of hypertension, TIA, tobacco abuse, menorrhagia week history of dizziness and syncopal type symptoms associated with generalized weakness and malaise.  Patient was seen in the emergency department on 11/08/2017 with similar symptoms.  CT of the brain was negative at that time.  The patient was discharged home with lisinopril/HCTZ and amlodipine.  The patient had been poorly compliant with her antihypertensive medications secondary to financial issues.  She states that she has been taking her lisinopril HCTZ and amlodipine since her ED visit on 11/08/2017.  However, she continued to have blood pressure up to 239/118 at the time presentation to the emergency department.  Patient states that the first day her last menstrual period was November 04, 2017.  The patient denied any chest pain, shortness breath, palpitations, nausea, vomiting, diarrhea.  She has not had any hematochezia, melena, hemoptysis, hematemesis.  Upon presentation, the patient was noted to have hemoglobin 6.1.  2 units PRBC were ordered.    Discharge Diagnoses:  Chronic blood loss anemia/symptomatic anemia -Secondary to menorrhagia -Check FOBT -iron saturation 7%, ferritin 4 -folate 5.3-->supplement -2 units PRBC ordered -FeraHeme x 1 -UA neg for hematuria -UDS--neg -home with ferrous sulfate bid  Menorrhagia -GYN consulted--discussed with Dr. Despina Hidden -depo-provera x 1 -IV Premarin and Megace started initially during hospitalization -pelvic US--normal  endometrial stripe and ovaries  Iron deficiency anemia -due to chronic blood loss -FeraHeme x 1 -start ferrous sulfate  Malignant hypertension -In part due to poor compliance with medications -Discontinue clonidine due to the patient's history of poor compliance and risk of rebound hypertension -Continue amlodipine and lisinopril/HCTZ  Hypokalemia -Repleted -Check magnesium--2.0  Tobacco abuse I have discussed tobacco cessation with the patient.  I have counseled the patient regarding the negative impacts of continued tobacco use including but not limited to lung cancer, COPD, and cardiovascular disease.  I have discussed alternatives to tobacco and modalities that may help facilitate tobacco cessation including but not limited to biofeedback, hypnosis, and medications.  Total time spent with tobacco counseling was 4 minutes.     Discharge Instructions   Allergies as of 11/16/2017   No Known Allergies     Medication List    TAKE these medications   amLODipine 10 MG tablet Commonly known as:  NORVASC Take 1 tablet (10 mg total) by mouth daily. Start taking on:  11/17/2017 What changed:    medication strength  how much to take   ferrous sulfate 325 (65 FE) MG tablet Take 1 tablet (325 mg total) by mouth 2 (two) times daily with a meal.   folic acid 1 MG tablet Commonly known as:  FOLVITE Take 1 tablet (1 mg total) by mouth daily.   lisinopril-hydrochlorothiazide 20-25 MG tablet Commonly known as:  PRINZIDE,ZESTORETIC Take 1 tablet by mouth daily.       No Known Allergies  Consultations:  GYN--Eure   Procedures/Studies: Ct Head Wo Contrast  Result Date: 11/08/2017 CLINICAL DATA:  Dizziness and headache for 3 days. EXAM: CT HEAD WITHOUT CONTRAST TECHNIQUE: Contiguous axial images were obtained from the base of the skull  through the vertex without intravenous contrast. COMPARISON:  09/27/2014 FINDINGS: Brain: No evidence of acute infarction, hemorrhage,  hydrocephalus, extra-axial collection or mass lesion/mass effect. Vascular: No hyperdense vessel or unexpected calcification. Skull: Normal. Negative for fracture or focal lesion. Sinuses/Orbits: No acute finding. Other: None. IMPRESSION: Normal exam. Electronically Signed   By: Signa Kell M.D.   On: 11/08/2017 13:22   US Pelvic Complete With Transvaginal  Result Date: 11/15/2017 CLINICAL DATA:  Menorrhagia and anemia. Heavy periods for 5 years. History of prior C-section. LMP 11/08/2017. EXAM: TRANSABDOMINAL AND TRANSVAGINAL ULTRASOUND OF PELVIS TECHNIQUE: Both transabdominal and transvaginal ultrasound examinations of the pelvis were performed. Transabdominal technique was performed for global imaging of the pelvis including uterus, ovaries, adnexal regions, and pelvic cul-de-sac. It was necessary to proceed with endovaginal exam following the transabdominal exam to visualize the endometrium, ovaries. COMPARISON:  03/12/2008 FINDINGS: Uterus Measurements: 10.3 x 5.4 x 6.3 centimeters. Multiple nabothian cysts are identified. No suspicious solid mass. C-section scar identified. Endometrium Thickness: 8.4 millimeters.  No focal abnormality visualized. Right ovary Measurements: 4.0 x 2.7 x 2.5 centimeters. Normal appearance/no adnexal mass. Left ovary Measurements: 3.3 x 1.5 x 2.2 centimeters. Normal appearance/no adnexal mass. Other findings No abnormal free fluid. Study quality is degraded by patient body habitus. IMPRESSION: 1. Normal appearance of the endometrial stripe. If bleeding remains unresponsive to hormonal or medical therapy, sonohysterogram should be considered for focal lesion work-up. (Ref: Radiological Reasoning: Algorithmic Workup of Abnormal Vaginal Bleeding with Endovaginal Sonography and Sonohysterography. AJR 2008; 914:N82-95) 2. Normal appearance of both ovaries. Electronically Signed   By: Norva Pavlov M.D.   On: 11/15/2017 15:45        Discharge Exam: Vitals:   11/15/17  2141 11/16/17 0714  BP: (!) 201/90 (!) 165/84  Pulse: 65 72  Resp: 16 16  Temp: 98.3 F (36.8 C) 97.7 F (36.5 C)  SpO2: 97% 98%   Vitals:   11/15/17 1651 11/15/17 1806 11/15/17 2141 11/16/17 0714  BP: (!) 187/96 (!) 175/95 (!) 201/90 (!) 165/84  Pulse: 67 72 65 72  Resp:   16 16  Temp: 97.8 F (36.6 C) 98.3 F (36.8 C) 98.3 F (36.8 C) 97.7 F (36.5 C)  TempSrc: Oral Oral Oral Oral  SpO2:   97% 98%  Weight:      Height:        General: Pt is alert, awake, not in acute distress Cardiovascular: RRR, S1/S2 +, no rubs, no gallops Respiratory: CTA bilaterally, no wheezing, no rhonchi Abdominal: Soft, NT, ND, bowel sounds + Extremities: no edema, no cyanosis   The results of significant diagnostics from this hospitalization (including imaging, microbiology, ancillary and laboratory) are listed below for reference.    Significant Diagnostic Studies: Ct Head Wo Contrast  Result Date: 11/08/2017 CLINICAL DATA:  Dizziness and headache for 3 days. EXAM: CT HEAD WITHOUT CONTRAST TECHNIQUE: Contiguous axial images were obtained from the base of the skull through the vertex without intravenous contrast. COMPARISON:  09/27/2014 FINDINGS: Brain: No evidence of acute infarction, hemorrhage, hydrocephalus, extra-axial collection or mass lesion/mass effect. Vascular: No hyperdense vessel or unexpected calcification. Skull: Normal. Negative for fracture or focal lesion. Sinuses/Orbits: No acute finding. Other: None. IMPRESSION: Normal exam. Electronically Signed   By: Signa Kell M.D.   On: 11/08/2017 13:22   US Pelvic Complete With Transvaginal  Result Date: 11/15/2017 CLINICAL DATA:  Menorrhagia and anemia. Heavy periods for 5 years. History of prior C-section. LMP 11/08/2017. EXAM: TRANSABDOMINAL AND TRANSVAGINAL ULTRASOUND OF PELVIS TECHNIQUE:  Both transabdominal and transvaginal ultrasound examinations of the pelvis were performed. Transabdominal technique was performed for global  imaging of the pelvis including uterus, ovaries, adnexal regions, and pelvic cul-de-sac. It was necessary to proceed with endovaginal exam following the transabdominal exam to visualize the endometrium, ovaries. COMPARISON:  03/12/2008 FINDINGS: Uterus Measurements: 10.3 x 5.4 x 6.3 centimeters. Multiple nabothian cysts are identified. No suspicious solid mass. C-section scar identified. Endometrium Thickness: 8.4 millimeters.  No focal abnormality visualized. Right ovary Measurements: 4.0 x 2.7 x 2.5 centimeters. Normal appearance/no adnexal mass. Left ovary Measurements: 3.3 x 1.5 x 2.2 centimeters. Normal appearance/no adnexal mass. Other findings No abnormal free fluid. Study quality is degraded by patient body habitus. IMPRESSION: 1. Normal appearance of the endometrial stripe. If bleeding remains unresponsive to hormonal or medical therapy, sonohysterogram should be considered for focal lesion work-up. (Ref: Radiological Reasoning: Algorithmic Workup of Abnormal Vaginal Bleeding with Endovaginal Sonography and Sonohysterography. AJR 2008; 161:[id number]) 2. Normal appearance of both ovaries. Electronically Signed   By: Norva PavlovElizabeth  Brown M.D.   On: 11/15/2017 15:45     Microbiology: Recent Results (from the past 240 hour(s))  MRSA PCR Screening     Status: None   Collection Time: 11/15/17 12:51 AM  Result Value Ref Range Status   MRSA by PCR NEGATIVE NEGATIVE Final    Comment:        The GeneXpert MRSA Assay (FDA approved for NASAL specimens only), is one component of a comprehensive MRSA colonization surveillance program. It is not intended to diagnose MRSA infection nor to guide or monitor treatment for MRSA infections. Performed at [facility], [address] [place], [place] [id number]      Labs: Basic Metabolic Panel: Recent Labs  Lab 11/14/17 2136 11/15/17 0540 11/16/17 0501  NA 141 138 138  K 2.8* 3.2* 3.3*  CL 105 105 104  CO2 26 26 27   GLUCOSE 106* 98 94  BUN 22* 18 18    CREATININE 1.13* 0.98 0.84  CALCIUM 9.5 8.9 9.1  MG 2.3  --  2.0   Liver Function Tests: No results for input(s): AST, ALT, ALKPHOS, BILITOT, PROT, ALBUMIN in the last 168 hours. No results for input(s): LIPASE, AMYLASE in the last 168 hours. No results for input(s): AMMONIA in the last 168 hours. CBC: Recent Labs  Lab 11/14/17 2136 11/15/17 0540 11/16/17 0501  WBC 7.0 5.2 7.1  HGB 6.1* 5.6* 7.8*  HCT 23.3* 21.5* 28.0*  MCV 60.8* 61.4* 65.3*  PLT 328 290 281   Cardiac Enzymes: No results for input(s): CKTOTAL, CKMB, CKMBINDEX, TROPONINI in the last 168 hours. BNP: Invalid input(s): POCBNP CBG: Recent Labs  Lab 11/14/17 2210  GLUCAP 102*    Time coordinating discharge:  36 minutes  Signed:  Catarina Hartshornavid Javani Spratt, DO [facility] Pager: [phone] 11/16/2017, 11:34 AM

## 2017-11-16 NOTE — Progress Notes (Signed)
Patient discharged home today per MD orders. Patient vital signs WDL. IV removed and site WDL. Discharge Instructions including follow up appointments, medications, and education reviewed with patient. Patient verbalizes understanding. Patient walked out and was escorted by nursing staff.

## 2017-11-19 LAB — URINE CULTURE

## 2017-12-10 ENCOUNTER — Ambulatory Visit (INDEPENDENT_AMBULATORY_CARE_PROVIDER_SITE_OTHER): Payer: Self-pay | Admitting: Obstetrics & Gynecology

## 2017-12-10 ENCOUNTER — Encounter (INDEPENDENT_AMBULATORY_CARE_PROVIDER_SITE_OTHER): Payer: Self-pay

## 2017-12-10 ENCOUNTER — Encounter: Payer: Self-pay | Admitting: Obstetrics & Gynecology

## 2017-12-10 VITALS — BP 149/100 | HR 84 | Ht 66.25 in | Wt 202.6 lb

## 2017-12-10 DIAGNOSIS — D500 Iron deficiency anemia secondary to blood loss (chronic): Secondary | ICD-10-CM

## 2017-12-10 DIAGNOSIS — N920 Excessive and frequent menstruation with regular cycle: Secondary | ICD-10-CM

## 2017-12-10 NOTE — Progress Notes (Signed)
Chief Complaint  Patient presents with  . having heavy vaginal bleeding    was seen at [facility] recently      37 y.o. [id number] Patient's last menstrual period was 11/09/2017. The current method of family planning is tubal ligation.  Outpatient Encounter Medications as of 12/10/2017  Medication Sig  . amLODipine (NORVASC) 10 MG tablet Take 1 tablet (10 mg total) by mouth daily.  . ferrous sulfate 325 (65 FE) MG tablet Take 1 tablet (325 mg total) by mouth 2 (two) times daily with a meal.  . folic acid (FOLVITE) 1 MG tablet Take 1 tablet (1 mg total) by mouth daily.  Marland Kitchen. lisinopril-hydrochlorothiazide (PRINZIDE,ZESTORETIC) 20-25 MG tablet Take 1 tablet by mouth daily.  . medroxyPROGESTERone (DEPO-PROVERA) 150 MG/ML injection Inject 150 mg into the muscle every 3 (three) months.  . [DISCONTINUED] IRON PO Take by mouth daily.   No facility-administered encounter medications on file as of 12/10/2017.     Subjective [name] is here for follow up of heavy prolonged vaginal bleeding She received IV premarin and given depo provera IM plus transfused PRBC and she has stopped bleeding Sonogram is normal I am following her up from her hospital admission Will proceed with endometrial ablation Past Medical History:  Diagnosis Date  . Diastolic dysfunction 09/28/2014   Grade 2. Mod LVH. EF 60%  . GERD (gastroesophageal reflux disease)   . Hypertension   . Hypertensive urgency 09/26/2014  . 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 MRI brain and C-spine unremarkable  . Microcytic anemia 09/27/2014   Heavy menstrual periods  . Preeclampsia     x3    Past Surgical History:  Procedure Laterality Date  . c-sections    . CESAREAN SECTION     x 3  . TUBAL LIGATION     2012    OB History    Gravida  3   Para  3   Term  2   Preterm  1   AB      Living  2     SAB      TAB      Ectopic      Multiple      Live Births  2            No Known Allergies  Social History   Socioeconomic History  . Marital status: Married    Spouse name: Not on file  . Number of children: Not on file  . Years of education: Not on file  . Highest education level: Not on file  Occupational History  . Not on file  Social Needs  . Financial resource strain: Not on file  . Food insecurity:    Worry: Not on file    Inability: Not on file  . Transportation needs:    Medical: Not on file    Non-medical: Not on file  Tobacco Use  . Smoking status: Current Every Day Smoker    Packs/day: 0.25    Years: 10.00    Pack years: 2.50    Types: Cigarettes  . Smokeless tobacco: Never Used  Substance and Sexual Activity  . Alcohol use: Not Currently  . Drug use: Never  . Sexual activity: Yes    Birth control/protection: Injection  Lifestyle  . Physical activity:    Days per week: Not on file    Minutes per session: Not on file  . Stress: Not on file  Relationships  . Social connections:    Talks on phone: Not on file    Gets together: Not on file    Attends religious service: Not on file    Active member of club or organization: Not on file    Attends meetings of clubs or organizations: Not on file    Relationship status: Not on file  Other Topics Concern  . Not on file  Social History Narrative  . Not on file    Family History  Problem Relation Age of Onset  . Hypertension Mother   . Diabetes Mother   . Anemia Mother   . Hypertension Father   . Diabetes Father   . Heart attack Paternal Grandfather   . Hypertension Paternal Grandmother   . Diabetes Paternal Grandmother   . Cancer Maternal Grandmother   . Alcohol abuse Maternal Grandfather   . Anemia Sister   . Hypertension Sister     Medications:       Current Outpatient Medications:  .  amLODipine (NORVASC) 10 MG tablet, Take 1 tablet (10 mg total) by mouth daily., Disp: 30 tablet, Rfl: 2 .  ferrous sulfate 325 (65 FE) MG tablet, Take 1 tablet (325 mg total)  by mouth 2 (two) times daily with a meal., Disp: 60 tablet, Rfl: 3 .  folic acid (FOLVITE) 1 MG tablet, Take 1 tablet (1 mg total) by mouth daily., Disp: 30 tablet, Rfl: 1 .  lisinopril-hydrochlorothiazide (PRINZIDE,ZESTORETIC) 20-25 MG tablet, Take 1 tablet by mouth daily., Disp: 30 tablet, Rfl: 2 .  medroxyPROGESTERone (DEPO-PROVERA) 150 MG/ML injection, Inject 150 mg into the muscle every 3 (three) months., Disp: , Rfl:  .  potassium chloride 20 MEQ TBCR, Take 20 mEq by mouth daily., Disp: 30 tablet, Rfl: 11  Objective Blood pressure (!) 149/100, pulse 84, height 5' 6.25" (1.683 m), weight 202 lb 9.6 oz (91.9 kg), last menstrual period 11/09/2017, not currently breastfeeding.  Gen WDWN female NAD  Pertinent ROS No burning with urination, frequency or urgency No nausea, vomiting or diarrhea Nor fever chills or other constitutional symptoms   Labs or studies Reviewed labs from [facility]    Impression Diagnoses this Encounter::   ICD-10-CM   1. Menorrhagia with regular cycle N92.0   2. Chronic blood loss anemia D50.0     Established relevant diagnosis(es):   Plan/Recommendations: No orders of the defined types were placed in this encounter.   Labs or Scans Ordered: No orders of the defined types were placed in this encounter.   Management:: Proceed with hysteroscopy uterine curettage endometrial abltion 01/01/2018  Follow up Return in about 1 month (around 01/09/2018) for Post Op, with Dr Despina Hidden.        Face to face time:  15 minutes  Greater than 50% of the visit time was spent in counseling and coordination of care with the patient.  The summary and outline of the counseling and care coordination is summarized in the note above.   All questions were answered.

## 2017-12-23 NOTE — Patient Instructions (Signed)
[name]  12/23/2017     @PREFPERIOPPHARMACY @   Your procedure is scheduled on  01/01/2018   Report to [facility] at  615   A.M.  Call this number if you have problems the morning of surgery:  [phone]   Remember:  Do not eat or drink after midnight.  You may drink clear liquids until  12 midnight 12/31/2017  .  Clear liquids allowed are:                    Water, Juice (non-citric and without pulp), Carbonated beverages, Clear Tea, Black Coffee only, Plain Jell-O only, Gatorade and Plain Popsicles only    Take these medicines the morning of surgery with A SIP OF WATER  Amlodipine, lisinopril.    Do not wear jewelry, make-up or nail polish.  Do not wear lotions, powders, or perfumes, or deodorant.  Do not shave 48 hours prior to surgery.  Men may shave face and neck.  Do not bring valuables to the hospital.  [facility] is not responsible for any belongings or valuables.  Contacts, dentures or bridgework may not be worn into surgery.  Leave your suitcase in the car.  After surgery it may be brought to your room.  For patients admitted to the hospital, discharge time will be determined by your treatment team.  Patients discharged the day of surgery will not be allowed to drive home.   Name and phone number of your driver:   family Special instructions:  None  Please read over the following fact sheets that you were given. Anesthesia Post-op Instructions and Care and Recovery After Surgery       Dilation and Curettage or Vacuum Curettage Dilation and curettage (D&C) and vacuum curettage are minor procedures. A D&C involves stretching (dilation) the cervix and scraping (curettage) the inside lining of the uterus (endometrium). During a D&C, tissue is gently scraped from the endometrium, starting from the top portion of the uterus down to the lowest part of the uterus (cervix). During a vacuum curettage, the lining and tissue in the uterus are  removed with the use of gentle suction. Curettage may be performed to either diagnose or treat a problem. As a diagnostic procedure, curettage is performed to examine tissues from the uterus. A diagnostic curettage may be done if you have:  Irregular bleeding in the uterus.  Bleeding with the development of clots.  Spotting between menstrual periods.  Prolonged menstrual periods or other abnormal bleeding.  Bleeding after menopause.  No menstrual period (amenorrhea).  A change in size and shape of the uterus.  Abnormal endometrial cells discovered during a Pap test.  As a treatment procedure, curettage may be performed for the following reasons:  Removal of an IUD (intrauterine device).  Removal of retained placenta after giving birth.  Abortion.  Miscarriage.  Removal of endometrial polyps.  Removal of uncommon types of noncancerous lumps (fibroids).  Tell a health care provider about:  Any allergies you have, including allergies to prescribed medicine or latex.  All medicines you are taking, including vitamins, herbs, eye drops, creams, and over-the-counter medicines. This is especially important if you take any blood-thinning medicine. Bring a list of all of your medicines to your appointment.  Any problems you or family members have had with anesthetic medicines.  Any blood disorders you have.  Any surgeries you have had.  Your medical  history and any medical conditions you have.  Whether you are pregnant or may be pregnant.  Recent vaginal infections you have had.  Recent menstrual periods, bleeding problems you have had, and what form of birth control (contraception) you use. What are the risks? Generally, this is a safe procedure. However, problems may occur, including:  Infection.  Heavy vaginal bleeding.  Allergic reactions to medicines.  Damage to the cervix or other structures or organs.  Development of scar tissue (adhesions) inside the  uterus, which can cause abnormal amounts of menstrual bleeding. This may make it harder to get pregnant in the future.  A hole (perforation) or puncture in the uterine wall. This is rare.  What happens before the procedure? Staying hydrated Follow instructions from your health care provider about hydration, which may include:  Up to 2 hours before the procedure - you may continue to drink clear liquids, such as water, clear fruit juice, black coffee, and plain tea.  Eating and drinking restrictions Follow instructions from your health care provider about eating and drinking, which may include:  8 hours before the procedure - stop eating heavy meals or foods such as meat, fried foods, or fatty foods.  6 hours before the procedure - stop eating light meals or foods, such as toast or cereal.  6 hours before the procedure - stop drinking milk or drinks that contain milk.  2 hours before the procedure - stop drinking clear liquids. If your health care provider told you to take your medicine(s) on the day of your procedure, take them with only a sip of water.  Medicines  Ask your health care provider about: ? Changing or stopping your regular medicines. This is especially important if you are taking diabetes medicines or blood thinners. ? Taking medicines such as aspirin and ibuprofen. These medicines can thin your blood. Do not take these medicines before your procedure if your health care provider instructs you not to.  You may be given antibiotic medicine to help prevent infection. General instructions  For 24 hours before your procedure, do not: ? Douche. ? Use tampons. ? Use medicines, creams, or suppositories in the vagina. ? Have sexual intercourse.  You may be given a pregnancy test on the day of the procedure.  Plan to have someone take you home from the hospital or clinic.  You may have a blood or urine sample taken.  If you will be going home right after the procedure,  plan to have someone with you for 24 hours. What happens during the procedure?  To reduce your risk of infection: ? Your health care team will wash or sanitize their hands. ? Your skin will be washed with soap.  An IV tube will be inserted into one of your veins.  You will be given one of the following: ? A medicine that numbs the area in and around the cervix (local anesthetic). ? A medicine to make you fall asleep (general anesthetic).  You will lie down on your back, with your feet in foot rests (stirrups).  The size and position of your uterus will be checked.  A lubricated instrument (speculum or Sims retractor) will be inserted into the back side of your vagina. The speculum will be used to hold apart the walls of your vagina so your health care provider can see your cervix.  A tool (tenaculum) will be attached to the lip of the cervix to stabilize it.  Your cervix will be softened and dilated. This  may be done by: ? Taking a medicine. ? Having tapered dilators or thin rods (laminaria) or gradual widening instruments (tapered dilators) inserted into your cervix.  A small, sharp, curved instrument (curette) will be used to scrape a small amount of tissue or cells from the endometrium or cervical canal. In some cases, gentle suction is applied with the curette. The curette will then be removed. The cells will be taken to a lab for testing. The procedure may vary among health care providers and hospitals. What happens after the procedure?  You may have mild cramping, backache, pain, and light bleeding or spotting. You may pass small blood clots from your vagina.  You may have to wear compression stockings. These stockings help to prevent blood clots and reduce swelling in your legs.  Your blood pressure, heart rate, breathing rate, and blood oxygen level will be monitored until the medicines you were given have worn off. Summary  Dilation and curettage (D&C) involves stretching  (dilation) the cervix and scraping (curettage) the inside lining of the uterus (endometrium).  After the procedure, you may have mild cramping, backache, pain, and light bleeding or spotting. You may pass small blood clots from your vagina.  Plan to have someone take you home from the hospital or clinic. This information is not intended to replace advice given to you by your health care provider. Make sure you discuss any questions you have with your health care provider. Document Released: 05/14/2005 Document Revised: 01/29/2016 Document Reviewed: 01/29/2016 Elsevier Interactive Patient Education  2018 [employer]  Dilation and Curettage or Vacuum Curettage, Care After These instructions give you information about caring for yourself after your procedure. Your doctor may also give you more specific instructions. Call your doctor if you have any problems or questions after your procedure. Follow these instructions at home: Activity  Do not drive or use heavy machinery while taking prescription pain medicine.  For 24 hours after your procedure, avoid driving.  Take short walks often, followed by rest periods. Ask your doctor what activities are safe for you. After one or two days, you may be able to return to your normal activities.  Do not lift anything that is heavier than 10 lb (4.5 kg) until your doctor approves.  For at least 2 weeks, or as long as told by your doctor: ? Do not douche. ? Do not use tampons. ? Do not have sex. General instructions  Take over-the-counter and prescription medicines only as told by your doctor. This is very important if you take blood thinning medicine.  Do not take baths, swim, or use a hot tub until your doctor approves. Take showers instead of baths.  Wear compression stockings as told by your doctor.  It is up to you to get the results of your procedure. Ask your doctor when your results will be ready.  Keep all follow-up visits as told by  your doctor. This is important. Contact a doctor if:  You have very bad cramps that get worse or do not get better with medicine.  You have very bad pain in your belly (abdomen).  You cannot drink fluids without throwing up (vomiting).  You get pain in a different part of the area between your belly and thighs (pelvis).  You have bad-smelling discharge from your vagina.  You have a rash. Get help right away if:  You are bleeding a lot from your vagina. A lot of bleeding means soaking more than one sanitary pad in an hour,  for 2 hours in a row.  You have clumps of blood (blood clots) coming from your vagina.  You have a fever or chills.  Your belly feels very tender or hard.  You have chest pain.  You have trouble breathing.  You cough up blood.  You feel dizzy.  You feel light-headed.  You pass out (faint).  You have pain in your neck or shoulder area. Summary  Take short walks often, followed by rest periods. Ask your doctor what activities are safe for you. After one or two days, you may be able to return to your normal activities.  Do not lift anything that is heavier than 10 lb (4.5 kg) until your doctor approves.  Do not take baths, swim, or use a hot tub until your doctor approves. Take showers instead of baths.  Contact your doctor if you have any symptoms of infection, like bad-smelling discharge from your vagina. This information is not intended to replace advice given to you by your health care provider. Make sure you discuss any questions you have with your health care provider. Document Released: 02/21/2008 Document Revised: 01/30/2016 Document Reviewed: 01/30/2016 Elsevier Interactive Patient Education  2017 [place]. Hysteroscopy Hysteroscopy is a procedure used for looking inside the womb (uterus). It may be done for various reasons, including:  To evaluate abnormal bleeding, fibroid (benign, noncancerous) tumors, polyps, scar tissue  (adhesions), and possibly cancer of the uterus.  To look for lumps (tumors) and other uterine growths.  To look for causes of why a woman cannot get pregnant (infertility), causes of recurrent loss of pregnancy (miscarriages), or a lost intrauterine device (IUD).  To perform a sterilization by blocking the fallopian tubes from inside the uterus.  In this procedure, a thin, flexible tube with a tiny light and camera on the end of it (hysteroscope) is used to look inside the uterus. A hysteroscopy should be done right after a menstrual period to be sure you are not pregnant. LET [facility] CARE PROVIDER KNOW ABOUT:  Any allergies you have.  All medicines you are taking, including vitamins, herbs, eye drops, creams, and over-the-counter medicines.  Previous problems you or members of your family have had with the use of anesthetics.  Any blood disorders you have.  Previous surgeries you have had.  Medical conditions you have. RISKS AND COMPLICATIONS Generally, this is a safe procedure. However, as with any procedure, complications can occur. Possible complications include:  Putting a hole in the uterus.  Excessive bleeding.  Infection.  Damage to the cervix.  Injury to other organs.  Allergic reaction to medicines.  Too much fluid used in the uterus for the procedure.  BEFORE THE PROCEDURE  Ask your health care provider about changing or stopping any regular medicines.  Do not take aspirin or blood thinners for 1 week before the procedure, or as directed by your health care provider. These can cause bleeding.  If you smoke, do not smoke for 2 weeks before the procedure.  In some cases, a medicine is placed in the cervix the day before the procedure. This medicine makes the cervix have a larger opening (dilate). This makes it easier for the instrument to be inserted into the uterus during the procedure.  Do not eat or drink anything for at least 8 hours before the  surgery.  Arrange for someone to take you home after the procedure. PROCEDURE  You may be given a medicine to relax you (sedative). You may also be given one  of the following: ? A medicine that numbs the area around the cervix (local anesthetic). ? A medicine that makes you sleep through the procedure (general anesthetic).  The hysteroscope is inserted through the vagina into the uterus. The camera on the hysteroscope sends a picture to a TV screen. This gives the surgeon a good view inside the uterus.  During the procedure, air or a liquid is put into the uterus, which allows the surgeon to see better.  Sometimes, tissue is gently scraped from inside the uterus. These tissue samples are sent to a lab for testing. What to expect after the procedure  If you had a general anesthetic, you may be groggy for a couple hours after the procedure.  If you had a local anesthetic, you will be able to go home as soon as you are stable and feel ready.  You may have some cramping. This normally lasts for a couple days.  You may have bleeding, which varies from light spotting for a few days to menstrual-like bleeding for 3-7 days. This is normal.  If your test results are not back during the visit, make an appointment with your health care provider to find out the results. This information is not intended to replace advice given to you by your health care provider. Make sure you discuss any questions you have with your health care provider. Document Released: 08/20/2000 Document Revised: 10/20/2015 Document Reviewed: 12/11/2012 Elsevier Interactive Patient Education  2017 [place]. Hysteroscopy, Care After Refer to this sheet in the next few weeks. These instructions provide you with information on caring for yourself after your procedure. Your health care provider may also give you more specific instructions. Your treatment has been planned according to current medical practices, but problems  sometimes occur. Call your health care provider if you have any problems or questions after your procedure. What can I expect after the procedure? After your procedure, it is typical to have the following:  You may have some cramping. This normally lasts for a couple days.  You may have bleeding. This can vary from light spotting for a few days to menstrual-like bleeding for 3-7 days.  Follow these instructions at home:  Rest for the first 1-2 days after the procedure.  Only take over-the-counter or prescription medicines as directed by your health care provider. Do not take aspirin. It can increase the chances of bleeding.  Take showers instead of baths for 2 weeks or as directed by your health care provider.  Do not drive for 24 hours or as directed.  Do not drink alcohol while taking pain medicine.  Do not use tampons, douche, or have sexual intercourse for 2 weeks or until your health care provider says it is okay.  Take your temperature twice a day for 4-5 days. Write it down each time.  Follow your health care provider's advice about diet, exercise, and lifting.  If you develop constipation, you may: ? Take a mild laxative if your health care provider approves. ? Add bran foods to your diet. ? Drink enough fluids to keep your urine clear or pale yellow.  Try to have someone with you or available to you for the first 24-48 hours, especially if you were given a general anesthetic.  Follow up with your health care provider as directed. Contact a health care provider if:  You feel dizzy or lightheaded.  You feel sick to your stomach (nauseous).  You have abnormal vaginal discharge.  You have a rash.  You have pain that is not controlled with medicine. Get help right away if:  You have bleeding that is heavier than a normal menstrual period.  You have a fever.  You have increasing cramps or pain, not controlled with medicine.  You have new belly (abdominal)  pain.  You pass out.  You have pain in the tops of your shoulders (shoulder strap areas).  You have shortness of breath. This information is not intended to replace advice given to you by your health care provider. Make sure you discuss any questions you have with your health care provider. Document Released: 03/04/2013 Document Revised: 10/20/2015 Document Reviewed: 12/11/2012 Elsevier Interactive Patient Education  2017 [place].  Endometrial Ablation Endometrial ablation is a procedure that destroys the thin inner layer of the lining of the uterus (endometrium). This procedure may be done:  To stop heavy periods.  To stop bleeding that is causing anemia.  To control irregular bleeding.  To treat bleeding caused by small tumors (fibroids) in the endometrium.  This procedure is often an alternative to major surgery, such as removal of the uterus and cervix (hysterectomy). As a result of this procedure:  You may not be able to have children. However, if you are premenopausal (you have not gone through menopause): ? You may still have a small chance of getting pregnant. ? You will need to use a reliable method of birth control after the procedure to prevent pregnancy.  You may stop having a menstrual period, or you may have only a small amount of bleeding during your period. Menstruation may return several years after the procedure.  Tell a health care provider about:  Any allergies you have.  All medicines you are taking, including vitamins, herbs, eye drops, creams, and over-the-counter medicines.  Any problems you or family members have had with the use of anesthetic medicines.  Any blood disorders you have.  Any surgeries you have had.  Any medical conditions you have. What are the risks? Generally, this is a safe procedure. However, problems may occur, including:  A hole (perforation) in the uterus or bowel.  Infection of the uterus, bladder, or  vagina.  Bleeding.  Damage to other structures or organs.  An air bubble in the lung (air embolus).  Problems with pregnancy after the procedure.  Failure of the procedure.  Decreased ability to diagnose cancer in the endometrium.  What happens before the procedure?  You will have tests of your endometrium to make sure there are no pre-cancerous cells or cancer cells present.  You may have an ultrasound of the uterus.  You may be given medicines to thin the endometrium.  Ask your health care provider about: ? Changing or stopping your regular medicines. This is especially important if you take diabetes medicines or blood thinners. ? Taking medicines such as aspirin and ibuprofen. These medicines can thin your blood. Do not take these medicines before your procedure if your doctor tells you not to.  Plan to have someone take you home from the hospital or clinic. What happens during the procedure?  You will lie on an exam table with your feet and legs supported as in a pelvic exam.  To lower your risk of infection: ? Your health care team will wash or sanitize their hands and put on germ-free (sterile) gloves. ? Your genital area will be washed with soap.  An IV tube will be inserted into one of your veins.  You will be given a medicine to  help you relax (sedative).  A surgical instrument with a light and camera (resectoscope) will be inserted into your vagina and moved into your uterus. This allows your surgeon to see inside your uterus.  Endometrial tissue will be removed using one of the following methods: ? Radiofrequency. This method uses a radiofrequency-alternating electric current to remove the endometrium. ? Cryotherapy. This method uses extreme cold to freeze the endometrium. ? Heated-free liquid. This method uses a heated saltwater (saline) solution to remove the endometrium. ? Microwave. This method uses high-energy microwaves to heat up the endometrium and  remove it. ? Thermal balloon. This method involves inserting a catheter with a balloon tip into the uterus. The balloon tip is filled with heated fluid to remove the endometrium. The procedure may vary among health care providers and hospitals. What happens after the procedure?  Your blood pressure, heart rate, breathing rate, and blood oxygen level will be monitored until the medicines you were given have worn off.  As tissue healing occurs, you may notice vaginal bleeding for 4-6 weeks after the procedure. You may also experience: ? Cramps. ? Thin, watery vaginal discharge that is light pink or brown in color. ? A need to urinate more frequently than usual. ? Nausea.  Do not drive for 24 hours if you were given a sedative.  Do not have sex or insert anything into your vagina until your health care provider approves. Summary  Endometrial ablation is done to treat the many causes of heavy menstrual bleeding.  The procedure may be done only after medications have been tried to control the bleeding.  Plan to have someone take you home from the hospital or clinic. This information is not intended to replace advice given to you by your health care provider. Make sure you discuss any questions you have with your health care provider. Document Released: 03/23/2004 Document Revised: 05/31/2016 Document Reviewed: 05/31/2016 Elsevier Interactive Patient Education  2017 [place] Anesthesia, Adult General anesthesia is the use of medicines to make a person "go to sleep" (be unconscious) for a medical procedure. General anesthesia is often recommended when a procedure:  Is long.  Requires you to be still or in an unusual position.  Is major and can cause you to lose blood.  Is impossible to do without general anesthesia.  The medicines used for general anesthesia are called general anesthetics. In addition to making you sleep, the medicines:  Prevent pain.  Control your  blood pressure.  Relax your muscles.  Tell a health care provider about:  Any allergies you have.  All medicines you are taking, including vitamins, herbs, eye drops, creams, and over-the-counter medicines.  Any problems you or family members have had with anesthetic medicines.  Types of anesthetics you have had in the past.  Any bleeding disorders you have.  Any surgeries you have had.  Any medical conditions you have.  Any history of heart or lung conditions, such as heart failure, sleep apnea, or chronic obstructive pulmonary disease (COPD).  Whether you are pregnant or may be pregnant.  Whether you use tobacco, alcohol, marijuana, or street drugs.  Any history of Armed forces logistics/support/administrative officer.  Any history of depression or anxiety. What are the risks? Generally, this is a safe procedure. However, problems may occur, including:  Allergic reaction to anesthetics.  Lung and heart problems.  Inhaling food or liquids from your stomach into your lungs (aspiration).  Injury to nerves.  Waking up during your procedure and being unable to  move (rare).  Extreme agitation or a state of mental confusion (delirium) when you wake up from the anesthetic.  Air in the bloodstream, which can lead to stroke.  These problems are more likely to develop if you are having a major surgery or if you have an advanced medical condition. You can prevent some of these complications by answering all of your health care provider's questions thoroughly and by following all pre-procedure instructions. General anesthesia can cause side effects, including:  Nausea or vomiting  A sore throat from the breathing tube.  Feeling cold or shivery.  Feeling tired, washed out, or achy.  Sleepiness or drowsiness.  Confusion or agitation.  What happens before the procedure? Staying hydrated Follow instructions from your health care provider about hydration, which may include:  Up to 2 hours before the  procedure - you may continue to drink clear liquids, such as water, clear fruit juice, black coffee, and plain tea.  Eating and drinking restrictions Follow instructions from your health care provider about eating and drinking, which may include:  8 hours before the procedure - stop eating heavy meals or foods such as meat, fried foods, or fatty foods.  6 hours before the procedure - stop eating light meals or foods, such as toast or cereal.  6 hours before the procedure - stop drinking milk or drinks that contain milk.  2 hours before the procedure - stop drinking clear liquids.  Medicines  Ask your health care provider about: ? Changing or stopping your regular medicines. This is especially important if you are taking diabetes medicines or blood thinners. ? Taking medicines such as aspirin and ibuprofen. These medicines can thin your blood. Do not take these medicines before your procedure if your health care provider instructs you not to. ? Taking new dietary supplements or medicines. Do not take these during the week before your procedure unless your health care provider approves them.  If you are told to take a medicine or to continue taking a medicine on the day of the procedure, take the medicine with sips of water. General instructions   Ask if you will be going home the same day, the following day, or after a longer hospital stay. ? Plan to have someone take you home. ? Plan to have someone stay with you for the first 24 hours after you leave the hospital or clinic.  For 3-6 weeks before the procedure, try not to use any tobacco products, such as cigarettes, chewing tobacco, and e-cigarettes.  You may brush your teeth on the morning of the procedure, but make sure to spit out the toothpaste. What happens during the procedure?  You will be given anesthetics through a mask and through an IV tube in one of your veins.  You may receive medicine to help you relax  (sedative).  As soon as you are asleep, a breathing tube may be used to help you breathe.  An anesthesia specialist will stay with you throughout the procedure. He or she will help keep you comfortable and safe by continuing to give you medicines and adjusting the amount of medicine that you get. He or she will also watch your blood pressure, pulse, and oxygen levels to make sure that the anesthetics do not cause any problems.  If a breathing tube was used to help you breathe, it will be removed before you wake up. The procedure may vary among health care providers and hospitals. What happens after the procedure?  You will wake  up, often slowly, after the procedure is complete, usually in a recovery area.  Your blood pressure, heart rate, breathing rate, and blood oxygen level will be monitored until the medicines you were given have worn off.  You may be given medicine to help you calm down if you feel anxious or agitated.  If you will be going home the same day, your health care provider may check to make sure you can stand, drink, and urinate.  Your health care providers will treat your pain and side effects before you go home.  Do not drive for 24 hours if you received a sedative.  You may: ? Feel nauseous and vomit. ? Have a sore throat. ? Have mental slowness. ? Feel cold or shivery. ? Feel sleepy. ? Feel tired. ? Feel sore or achy, even in parts of your body where you did not have surgery. This information is not intended to replace advice given to you by your health care provider. Make sure you discuss any questions you have with your health care provider. Document Released: 08/21/2007 Document Revised: 10/25/2015 Document Reviewed: 04/28/2015 Elsevier Interactive Patient Education  2018 [employer] Anesthesia, Adult, Care After These instructions provide you with information about caring for yourself after your procedure. Your health care provider may also give  you more specific instructions. Your treatment has been planned according to current medical practices, but problems sometimes occur. Call your health care provider if you have any problems or questions after your procedure. What can I expect after the procedure? After the procedure, it is common to have:  Vomiting.  A sore throat.  Mental slowness.  It is common to feel:  Nauseous.  Cold or shivery.  Sleepy.  Tired.  Sore or achy, even in parts of your body where you did not have surgery.  Follow these instructions at home: For at least 24 hours after the procedure:  Do not: ? Participate in activities where you could fall or become injured. ? Drive. ? Use heavy machinery. ? Drink alcohol. ? Take sleeping pills or medicines that cause drowsiness. ? Make important decisions or sign legal documents. ? Take care of children on your own.  Rest. Eating and drinking  If you vomit, drink water, juice, or soup when you can drink without vomiting.  Drink enough fluid to keep your urine clear or pale yellow.  Make sure you have little or no nausea before eating solid foods.  Follow the diet recommended by your health care provider. General instructions  Have a responsible adult stay with you until you are awake and alert.  Return to your normal activities as told by your health care provider. Ask your health care provider what activities are safe for you.  Take over-the-counter and prescription medicines only as told by your health care provider.  If you smoke, do not smoke without supervision.  Keep all follow-up visits as told by your health care provider. This is important. Contact a health care provider if:  You continue to have nausea or vomiting at home, and medicines are not helpful.  You cannot drink fluids or start eating again.  You cannot urinate after 8-12 hours.  You develop a skin rash.  You have fever.  You have increasing redness at the site  of your procedure. Get help right away if:  You have difficulty breathing.  You have chest pain.  You have unexpected bleeding.  You feel that you are having a life-threatening or urgent problem. This  information is not intended to replace advice given to you by your health care provider. Make sure you discuss any questions you have with your health care provider. Document Released: 08/20/2000 Document Revised: 10/17/2015 Document Reviewed: 04/28/2015 Elsevier Interactive Patient Education  [employer].

## 2017-12-25 ENCOUNTER — Other Ambulatory Visit: Payer: Self-pay | Admitting: Obstetrics & Gynecology

## 2017-12-25 ENCOUNTER — Encounter (HOSPITAL_COMMUNITY): Payer: Self-pay

## 2017-12-25 ENCOUNTER — Other Ambulatory Visit: Payer: Self-pay

## 2017-12-25 ENCOUNTER — Encounter (HOSPITAL_COMMUNITY)
Admission: RE | Admit: 2017-12-25 | Discharge: 2017-12-25 | Disposition: A | Discharge status: Home / Self Care | Payer: Medicaid Other | Source: Ambulatory Visit | Attending: Obstetrics & Gynecology | Admitting: Obstetrics & Gynecology

## 2017-12-25 DIAGNOSIS — Z01812 Encounter for preprocedural laboratory examination: Secondary | ICD-10-CM | POA: Diagnosis not present

## 2017-12-25 HISTORY — DX: Gastro-esophageal reflux disease without esophagitis: K21.9

## 2017-12-25 LAB — HCG, QUANTITATIVE, PREGNANCY

## 2017-12-25 LAB — COMPREHENSIVE METABOLIC PANEL
ALBUMIN: 3.7 g/dL (ref 3.5–5.0)
ALT: 29 U/L (ref 0–44)
AST: 30 U/L (ref 15–41)
Alkaline Phosphatase: 119 U/L (ref 38–126)
Anion gap: 11 (ref 5–15)
BUN: 23 mg/dL — ABNORMAL HIGH (ref 6–20)
CHLORIDE: 105 mmol/L (ref 98–111)
CO2: 22 mmol/L (ref 22–32)
Calcium: 9.2 mg/dL (ref 8.9–10.3)
Creatinine, Ser: 0.83 mg/dL (ref 0.44–1.00)
GFR calc Af Amer: >60 mL/min (ref >60)
GFR calc non Af Amer: >60 mL/min (ref >60)
Glucose, Bld: 97 mg/dL (ref 70–99)
POTASSIUM: 2.7 mmol/L — AB (ref 3.5–5.1)
Sodium: 138 mmol/L (ref 135–145)
Total Bilirubin: 0.5 mg/dL (ref 0.3–1.2)
Total Protein: 8.5 g/dL — ABNORMAL HIGH (ref 6.5–8.1)

## 2017-12-25 LAB — URINALYSIS, ROUTINE W REFLEX MICROSCOPIC
Bilirubin Urine: NEGATIVE
Glucose, UA: NEGATIVE mg/dL
Ketones, ur: NEGATIVE mg/dL
Nitrite: NEGATIVE
PH: 5 (ref 5.0–8.0)
Protein, ur: NEGATIVE mg/dL
Specific Gravity, Urine: 1.021 (ref 1.005–1.030)

## 2017-12-25 LAB — RAPID HIV SCREEN (HIV 1/2 AB+AG)
HIV 1/2 Antibodies: NONREACTIVE
HIV-1 P24 ANTIGEN - HIV24: NONREACTIVE

## 2017-12-25 LAB — CBC
HCT: 33.1 % — ABNORMAL LOW (ref 36.0–46.0)
Hemoglobin: 10.4 g/dL — ABNORMAL LOW (ref 12.0–15.0)
MCH: 23 pg — ABNORMAL LOW (ref 26.0–34.0)
MCHC: 31.4 g/dL (ref 30.0–36.0)
MCV: 73.2 fL — ABNORMAL LOW (ref 78.0–100.0)
PLATELETS: 271 10*3/uL (ref 150–400)
RBC: 4.52 MIL/uL (ref 3.87–5.11)
WBC: 6 10*3/uL (ref 4.0–10.5)

## 2017-12-25 NOTE — Pre-Procedure Instructions (Signed)
Potasium of 2.7 called by Almeta Monashase from lab. Dr Despina HiddenEure notified of this.

## 2017-12-30 ENCOUNTER — Ambulatory Visit (HOSPITAL_COMMUNITY): Payer: Medicaid Other | Admitting: Anesthesiology

## 2017-12-30 ENCOUNTER — Telehealth: Payer: Self-pay | Admitting: *Deleted

## 2017-12-30 NOTE — Telephone Encounter (Signed)
LMOVM that potassium is low and Rx for potassium is being sent to her pharmacy. Advised she should take it 3 times a day until her surgery.  Rx for Kdur 20mEq TID x 3 days called into [facility] in [place] per Dr Despina HiddenEure.

## 2018-01-01 ENCOUNTER — Other Ambulatory Visit: Payer: Self-pay | Admitting: Obstetrics & Gynecology

## 2018-01-01 ENCOUNTER — Encounter (HOSPITAL_COMMUNITY): Payer: Self-pay | Admitting: *Deleted

## 2018-01-01 ENCOUNTER — Encounter (HOSPITAL_COMMUNITY)
Admission: RE | Disposition: A | Discharge status: Home / Self Care | Payer: Self-pay | Source: Ambulatory Visit | Attending: Obstetrics & Gynecology

## 2018-01-01 ENCOUNTER — Ambulatory Visit (HOSPITAL_COMMUNITY)
Admission: RE | Admit: 2018-01-01 | Discharge: 2018-01-01 | Disposition: A | Discharge status: Home / Self Care | Payer: Medicaid Other | Source: Ambulatory Visit | Attending: Obstetrics & Gynecology | Admitting: Obstetrics & Gynecology

## 2018-01-01 ENCOUNTER — Encounter: Payer: Self-pay | Admitting: Obstetrics & Gynecology

## 2018-01-01 DIAGNOSIS — I10 Essential (primary) hypertension: Secondary | ICD-10-CM | POA: Insufficient documentation

## 2018-01-01 DIAGNOSIS — N920 Excessive and frequent menstruation with regular cycle: Secondary | ICD-10-CM | POA: Diagnosis present

## 2018-01-01 DIAGNOSIS — Z9851 Tubal ligation status: Secondary | ICD-10-CM | POA: Diagnosis not present

## 2018-01-01 DIAGNOSIS — Z793 Long term (current) use of hormonal contraceptives: Secondary | ICD-10-CM | POA: Insufficient documentation

## 2018-01-01 DIAGNOSIS — D500 Iron deficiency anemia secondary to blood loss (chronic): Secondary | ICD-10-CM | POA: Insufficient documentation

## 2018-01-01 DIAGNOSIS — Z5309 Procedure and treatment not carried out because of other contraindication: Secondary | ICD-10-CM | POA: Insufficient documentation

## 2018-01-01 DIAGNOSIS — F17210 Nicotine dependence, cigarettes, uncomplicated: Secondary | ICD-10-CM | POA: Insufficient documentation

## 2018-01-01 DIAGNOSIS — Z79899 Other long term (current) drug therapy: Secondary | ICD-10-CM | POA: Diagnosis not present

## 2018-01-01 LAB — POCT I-STAT 4, (NA,K, GLUC, HGB,HCT)
Glucose, Bld: 99 mg/dL (ref 70–99)
HCT: 30 % — ABNORMAL LOW (ref 36.0–46.0)
HEMOGLOBIN: 10.2 g/dL — AB (ref 12.0–15.0)
Potassium: 2.7 mmol/L — CL (ref 3.5–5.1)
Sodium: 141 mmol/L (ref 135–145)

## 2018-01-01 SURGERY — DILATATION & CURETTAGE/HYSTEROSCOPY WITH NOVASURE ABLATION
Anesthesia: General

## 2018-01-01 MED ORDER — FENTANYL CITRATE (PF) 100 MCG/2ML IJ SOLN
INTRAMUSCULAR | Status: AC
  Filled 2018-01-01: qty 2

## 2018-01-01 MED ORDER — ONDANSETRON HCL 4 MG/2ML IJ SOLN
INTRAMUSCULAR | Status: AC
  Filled 2018-01-01: qty 2

## 2018-01-01 MED ORDER — MIDAZOLAM HCL 2 MG/2ML IJ SOLN
INTRAMUSCULAR | Status: AC
  Filled 2018-01-01: qty 2

## 2018-01-01 MED ORDER — POTASSIUM CHLORIDE ER 20 MEQ PO TBCR: 20.0000 meq | EXTENDED_RELEASE_TABLET | Freq: Every day | ORAL | 11 refills | Status: DC

## 2018-01-01 MED ORDER — PROPOFOL 10 MG/ML IV BOLUS
INTRAVENOUS | Status: AC
  Filled 2018-01-01: qty 40

## 2018-01-01 MED ORDER — LIDOCAINE HCL (PF) 1 % IJ SOLN
INTRAMUSCULAR | Status: AC
  Filled 2018-01-01: qty 5

## 2018-01-01 MED ORDER — CEFAZOLIN SODIUM-DEXTROSE 2-4 GM/100ML-% IV SOLN
2.0000 g | INTRAVENOUS | Status: DC
  Filled 2018-01-01: qty 100

## 2018-01-01 MED ORDER — KETOROLAC TROMETHAMINE 30 MG/ML IJ SOLN
30.0000 mg | Freq: Once | INTRAMUSCULAR | Status: DC
  Filled 2018-01-01: qty 1

## 2018-01-01 MED ORDER — DEXAMETHASONE SODIUM PHOSPHATE 4 MG/ML IJ SOLN
INTRAMUSCULAR | Status: AC
  Filled 2018-01-01: qty 1

## 2018-01-01 NOTE — Progress Notes (Signed)
Upon pt arrival. Labs drawn on 12/25/17 & K+ 2.7. Pt. Stated that she had not picked up prescription for K+ that was called in on 12/30/17.  Istat drawn & K+ was still 2.7. Dr. Lemont FillersNabonsal cancelled procedure d/t low K+ level. Dr. Despina HiddenEure notified of situation & in to speak with pt. Pt instructed to pick ordered prescription & will reschedule soon. Pt. Verbalized full understanding & DC home.

## 2018-01-06 ENCOUNTER — Other Ambulatory Visit: Payer: Self-pay | Admitting: Obstetrics & Gynecology

## 2018-01-09 ENCOUNTER — Encounter: Payer: Self-pay | Admitting: Obstetrics & Gynecology

## 2018-01-13 ENCOUNTER — Encounter (HOSPITAL_COMMUNITY)
Admission: RE | Admit: 2018-01-13 | Discharge: 2018-01-13 | Disposition: A | Discharge status: Home / Self Care | Payer: Medicaid Other | Source: Ambulatory Visit | Attending: Obstetrics & Gynecology | Admitting: Obstetrics & Gynecology

## 2018-01-13 DIAGNOSIS — Z01812 Encounter for preprocedural laboratory examination: Secondary | ICD-10-CM | POA: Insufficient documentation

## 2018-01-13 LAB — POTASSIUM: POTASSIUM: 2.8 mmol/L — AB (ref 3.5–5.1)

## 2018-01-13 LAB — HCG, SERUM, QUALITATIVE: Preg, Serum: NEGATIVE

## 2018-01-13 NOTE — Patient Instructions (Signed)
[name]  01/13/2018     @PREFPERIOPPHARMACY @   Your procedure is scheduled on 01/15/2018.  [facility] at [facility].  Call this number if you have problems the morning of surgery:  [phone]   Remember:  Do not eat or drink after midnight.     Take these medicines the morning of surgery with A SIP OF WATER : Amlodipine and Lisinopril    Do not wear jewelry, make-up or nail polish.  Do not wear lotions, powders, or perfumes, or deodorant.  Do not shave 48 hours prior to surgery.  Men may shave face and neck.  Do not bring valuables to the hospital.  [facility] any belongings or valuables.  Contacts, dentures or bridgework may not be worn into surgery.  Leave your suitcase in the car.  After surgery it may be brought to your room.  For patients admitted to the hospital, discharge time will be determined by your treatment team.  Patients discharged the day of surgery will not be allowed to drive home.   Name and phone number of your driver:   family Special instructions:  n/a  Please read over the following fact sheets that you were given. Care and Recovery After Surgery    Endometrial Ablation Endometrial ablation is a procedure that destroys the thin inner layer of the lining of the uterus (endometrium). This procedure may be done:  To stop heavy periods.  To stop bleeding that is causing anemia.  To control irregular bleeding.  To treat bleeding caused by small tumors (fibroids) in the endometrium.  This procedure is often an alternative to major surgery, such as removal of the uterus and cervix (hysterectomy). As a result of this procedure:  You may not be able to have children. However, if you are premenopausal (you have not gone through menopause): ? You may still have a small chance of getting pregnant. ? You will need to use a reliable method of birth control after the procedure to prevent pregnancy.  You may stop  having a menstrual period, or you may have only a small amount of bleeding during your period. Menstruation may return several years after the procedure.  Tell a health care provider about:  Any allergies you have.  All medicines you are taking, including vitamins, herbs, eye drops, creams, and over-the-counter medicines.  Any problems you or family members have had with the use of anesthetic medicines.  Any blood disorders you have.  Any surgeries you have had.  Any medical conditions you have. What are the risks? Generally, this is a safe procedure. However, problems may occur, including:  A hole (perforation) in the uterus or bowel.  Infection of the uterus, bladder, or vagina.  Bleeding.  Damage to other structures or organs.  An air bubble in the lung (air embolus).  Problems with pregnancy after the procedure.  Failure of the procedure.  Decreased ability to diagnose cancer in the endometrium.  What happens before the procedure?  You will have tests of your endometrium to make sure there are no pre-cancerous cells or cancer cells present.  You may have an ultrasound of the uterus.  You may be given medicines to thin the endometrium.  Ask your health care provider about: ? Changing or stopping your regular medicines. This is especially important if you take diabetes medicines or blood thinners. ? Taking medicines such as aspirin and ibuprofen. These medicines can thin your blood. Do not  take these medicines before your procedure if your doctor tells you not to.  Plan to have someone take you home from the hospital or clinic. What happens during the procedure?  You will lie on an exam table with your feet and legs supported as in a pelvic exam.  To lower your risk of infection: ? Your health care team will wash or sanitize their hands and put on germ-free (sterile) gloves. ? Your genital area will be washed with soap.  An IV tube will be inserted into one of  your veins.  You will be given a medicine to help you relax (sedative).  A surgical instrument with a light and camera (resectoscope) will be inserted into your vagina and moved into your uterus. This allows your surgeon to see inside your uterus.  Endometrial tissue will be removed using one of the following methods: ? Radiofrequency. This method uses a radiofrequency-alternating electric current to remove the endometrium. ? Cryotherapy. This method uses extreme cold to freeze the endometrium. ? Heated-free liquid. This method uses a heated saltwater (saline) solution to remove the endometrium. ? Microwave. This method uses high-energy microwaves to heat up the endometrium and remove it. ? Thermal balloon. This method involves inserting a catheter with a balloon tip into the uterus. The balloon tip is filled with heated fluid to remove the endometrium. The procedure may vary among health care providers and hospitals. What happens after the procedure?  Your blood pressure, heart rate, breathing rate, and blood oxygen level will be monitored until the medicines you were given have worn off.  As tissue healing occurs, you may notice vaginal bleeding for 4-6 weeks after the procedure. You may also experience: ? Cramps. ? Thin, watery vaginal discharge that is light pink or brown in color. ? A need to urinate more frequently than usual. ? Nausea.  Do not drive for 24 hours if you were given a sedative.  Do not have sex or insert anything into your vagina until your health care provider approves. Summary  Endometrial ablation is done to treat the many causes of heavy menstrual bleeding.  The procedure may be done only after medications have been tried to control the bleeding.  Plan to have someone take you home from the hospital or clinic. This information is not intended to replace advice given to you by your health care provider. Make sure you discuss any questions you have with your  health care provider. Document Released: 03/23/2004 Document Revised: 05/31/2016 Document Reviewed: 05/31/2016 Elsevier Interactive Patient Education  2017 [employer]

## 2018-01-15 ENCOUNTER — Encounter (HOSPITAL_COMMUNITY): Payer: Self-pay | Admitting: *Deleted

## 2018-01-15 ENCOUNTER — Encounter (HOSPITAL_COMMUNITY)
Admission: RE | Disposition: A | Discharge status: Home / Self Care | Payer: Self-pay | Source: Ambulatory Visit | Attending: Obstetrics & Gynecology

## 2018-01-15 ENCOUNTER — Ambulatory Visit (HOSPITAL_COMMUNITY): Payer: Medicaid Other | Admitting: Anesthesiology

## 2018-01-15 ENCOUNTER — Ambulatory Visit (HOSPITAL_COMMUNITY)
Admission: RE | Admit: 2018-01-15 | Discharge: 2018-01-15 | Disposition: A | Discharge status: Home / Self Care | Payer: Medicaid Other | Source: Ambulatory Visit | Attending: Obstetrics & Gynecology | Admitting: Obstetrics & Gynecology

## 2018-01-15 DIAGNOSIS — D500 Iron deficiency anemia secondary to blood loss (chronic): Secondary | ICD-10-CM | POA: Insufficient documentation

## 2018-01-15 DIAGNOSIS — N840 Polyp of corpus uteri: Secondary | ICD-10-CM | POA: Diagnosis not present

## 2018-01-15 DIAGNOSIS — N921 Excessive and frequent menstruation with irregular cycle: Secondary | ICD-10-CM | POA: Diagnosis present

## 2018-01-15 DIAGNOSIS — F17210 Nicotine dependence, cigarettes, uncomplicated: Secondary | ICD-10-CM | POA: Diagnosis not present

## 2018-01-15 DIAGNOSIS — Z8249 Family history of ischemic heart disease and other diseases of the circulatory system: Secondary | ICD-10-CM | POA: Insufficient documentation

## 2018-01-15 DIAGNOSIS — E876 Hypokalemia: Secondary | ICD-10-CM | POA: Insufficient documentation

## 2018-01-15 DIAGNOSIS — I10 Essential (primary) hypertension: Secondary | ICD-10-CM | POA: Insufficient documentation

## 2018-01-15 DIAGNOSIS — K219 Gastro-esophageal reflux disease without esophagitis: Secondary | ICD-10-CM | POA: Diagnosis not present

## 2018-01-15 HISTORY — PX: DILITATION & CURRETTAGE/HYSTROSCOPY WITH NOVASURE ABLATION: SHX5568

## 2018-01-15 LAB — POCT I-STAT 4, (NA,K, GLUC, HGB,HCT)
GLUCOSE: 103 mg/dL — AB (ref 70–99)
HCT: 30 % — ABNORMAL LOW (ref 36.0–46.0)
Hemoglobin: 10.2 g/dL — ABNORMAL LOW (ref 12.0–15.0)
POTASSIUM: 2.9 mmol/L — AB (ref 3.5–5.1)
SODIUM: 140 mmol/L (ref 135–145)

## 2018-01-15 SURGERY — DILATATION & CURETTAGE/HYSTEROSCOPY WITH NOVASURE ABLATION
Anesthesia: General | Site: Uterus

## 2018-01-15 MED ORDER — LACTATED RINGERS IV SOLN
INTRAVENOUS | Status: DC
  Administered 2018-01-15: 11:00:00 via INTRAVENOUS

## 2018-01-15 MED ORDER — HYDROCODONE-ACETAMINOPHEN 5-325 MG PO TABS: 1.0000 | ORAL_TABLET | Freq: Four times a day (QID) | ORAL | 0 refills | Status: DC | PRN

## 2018-01-15 MED ORDER — KETOROLAC TROMETHAMINE 30 MG/ML IJ SOLN
INTRAMUSCULAR | Status: AC
  Filled 2018-01-15: qty 1

## 2018-01-15 MED ORDER — ONDANSETRON 8 MG PO TBDP: 8.0000 mg | ORAL_TABLET | Freq: Three times a day (TID) | ORAL | 0 refills | Status: DC | PRN

## 2018-01-15 MED ORDER — KETOROLAC TROMETHAMINE 10 MG PO TABS: 10.0000 mg | ORAL_TABLET | Freq: Three times a day (TID) | ORAL | 0 refills | Status: DC | PRN

## 2018-01-15 MED ORDER — MEPERIDINE HCL 50 MG/ML IJ SOLN: 6.2500 mg | INTRAMUSCULAR | Status: DC | PRN

## 2018-01-15 MED ORDER — FENTANYL CITRATE (PF) 100 MCG/2ML IJ SOLN
INTRAMUSCULAR | Status: AC
  Filled 2018-01-15: qty 2

## 2018-01-15 MED ORDER — MIDAZOLAM HCL 5 MG/5ML IJ SOLN
INTRAMUSCULAR | Status: DC | PRN
  Administered 2018-01-15: 2 mg via INTRAVENOUS

## 2018-01-15 MED ORDER — HYDROMORPHONE HCL 1 MG/ML IJ SOLN
0.2500 mg | INTRAMUSCULAR | Status: DC | PRN
  Administered 2018-01-15: 0.5 mg via INTRAVENOUS

## 2018-01-15 MED ORDER — LIDOCAINE HCL (PF) 1 % IJ SOLN
INTRAMUSCULAR | Status: AC
  Filled 2018-01-15: qty 5

## 2018-01-15 MED ORDER — ONDANSETRON HCL 4 MG/2ML IJ SOLN
INTRAMUSCULAR | Status: DC | PRN
  Administered 2018-01-15: 4 mg via INTRAVENOUS

## 2018-01-15 MED ORDER — FENTANYL CITRATE (PF) 100 MCG/2ML IJ SOLN
INTRAMUSCULAR | Status: DC | PRN
  Administered 2018-01-15: 50 ug via INTRAVENOUS

## 2018-01-15 MED ORDER — HYDROMORPHONE HCL 1 MG/ML IJ SOLN
INTRAMUSCULAR | Status: AC
  Filled 2018-01-15: qty 0.5

## 2018-01-15 MED ORDER — PROPOFOL 10 MG/ML IV BOLUS
INTRAVENOUS | Status: AC
  Filled 2018-01-15: qty 20

## 2018-01-15 MED ORDER — PROMETHAZINE HCL 25 MG/ML IJ SOLN: 6.2500 mg | INTRAMUSCULAR | Status: DC | PRN

## 2018-01-15 MED ORDER — CEFAZOLIN SODIUM-DEXTROSE 2-4 GM/100ML-% IV SOLN
2.0000 g | INTRAVENOUS | Status: AC
  Administered 2018-01-15: 2 g via INTRAVENOUS

## 2018-01-15 MED ORDER — LACTATED RINGERS IV SOLN: INTRAVENOUS | Status: DC

## 2018-01-15 MED ORDER — MIDAZOLAM HCL 2 MG/2ML IJ SOLN
INTRAMUSCULAR | Status: AC
  Filled 2018-01-15: qty 2

## 2018-01-15 MED ORDER — 0.9 % SODIUM CHLORIDE (POUR BTL) OPTIME
TOPICAL | Status: DC | PRN
  Administered 2018-01-15 (×2): 1000 mL

## 2018-01-15 MED ORDER — CEFAZOLIN SODIUM-DEXTROSE 2-4 GM/100ML-% IV SOLN
INTRAVENOUS | Status: AC
  Filled 2018-01-15: qty 100

## 2018-01-15 MED ORDER — HYDROCODONE-ACETAMINOPHEN 7.5-325 MG PO TABS: 1.0000 | ORAL_TABLET | Freq: Once | ORAL | Status: DC | PRN

## 2018-01-15 MED ORDER — KETOROLAC TROMETHAMINE 30 MG/ML IJ SOLN
30.0000 mg | Freq: Once | INTRAMUSCULAR | Status: AC
  Administered 2018-01-15: 30 mg via INTRAVENOUS

## 2018-01-15 MED ORDER — ONDANSETRON HCL 4 MG/2ML IJ SOLN
INTRAMUSCULAR | Status: AC
  Filled 2018-01-15: qty 2

## 2018-01-15 MED ORDER — LIDOCAINE HCL 1 % IJ SOLN
INTRAMUSCULAR | Status: DC | PRN
  Administered 2018-01-15: 25 mg via INTRADERMAL

## 2018-01-15 MED ORDER — PROPOFOL 10 MG/ML IV BOLUS
INTRAVENOUS | Status: DC | PRN
  Administered 2018-01-15: 200 mg via INTRAVENOUS

## 2018-01-15 SURGICAL SUPPLY — 27 items
CLOTH BEACON ORANGE TIMEOUT ST (SAFETY) ×3 IMPLANT
COVER LIGHT HANDLE STERIS (MISCELLANEOUS) ×6 IMPLANT
GAUZE 4X4 16PLY RFD (DISPOSABLE) ×3 IMPLANT
GAUZE SPONGE 4X4 12PLY STRL (GAUZE/BANDAGES/DRESSINGS) ×3 IMPLANT
GLOVE BIOGEL PI IND STRL 7.0 (GLOVE) ×2 IMPLANT
GLOVE BIOGEL PI IND STRL 8 (GLOVE) ×1 IMPLANT
GLOVE BIOGEL PI INDICATOR 7.0 (GLOVE) ×4
GLOVE BIOGEL PI INDICATOR 8 (GLOVE) ×2
GLOVE ECLIPSE 8.0 STRL XLNG CF (GLOVE) ×3 IMPLANT
GOWN STRL REUS W/TWL LRG LVL3 (GOWN DISPOSABLE) ×3 IMPLANT
GOWN STRL REUS W/TWL XL LVL3 (GOWN DISPOSABLE) ×3 IMPLANT
HANDPIECE ABLA MINERVA ENDO (MISCELLANEOUS) ×3 IMPLANT
INST SET HYSTEROSCOPY (KITS) ×3 IMPLANT
IV NS 1000ML (IV SOLUTION) ×3
IV NS 1000ML BAXH (IV SOLUTION) ×1 IMPLANT
KIT TURNOVER CYSTO (KITS) ×3 IMPLANT
MANIFOLD NEPTUNE II (INSTRUMENTS) ×3 IMPLANT
MARKER SKIN DUAL TIP RULER LAB (MISCELLANEOUS) ×3 IMPLANT
NS IRRIG 1000ML POUR BTL (IV SOLUTION) ×3 IMPLANT
PACK BASIC III (CUSTOM PROCEDURE TRAY) ×3
PACK SRG BSC III STRL LF ECLPS (CUSTOM PROCEDURE TRAY) ×1 IMPLANT
PAD ARMBOARD 7.5X6 YLW CONV (MISCELLANEOUS) ×3 IMPLANT
PAD TELFA 3X4 1S STER (GAUZE/BANDAGES/DRESSINGS) ×3 IMPLANT
SET BASIN LINEN APH (SET/KITS/TRAYS/PACK) ×3 IMPLANT
SET IRRIG Y TYPE TUR BLADDER L (SET/KITS/TRAYS/PACK) ×3 IMPLANT
SHEET LAVH (DRAPES) ×3 IMPLANT
YANKAUER SUCT BULB TIP 10FT TU (MISCELLANEOUS) ×3 IMPLANT

## 2018-01-15 NOTE — Transfer of Care (Signed)
Immediate Anesthesia Transfer of Care Note  Patient: Melinda MerlinMonica Y Henry  Procedure(s) Performed: DILATATION & CURETTAGE/HYSTEROSCOPY WITH MINERVA ABLATION (N/A Uterus)  Patient Location: PACU  Anesthesia Type:General  Level of Consciousness: awake and patient cooperative  Airway & Oxygen Therapy: Patient Spontanous Breathing and Patient connected to nasal cannula oxygen  Post-op Assessment: Report given to RN, Post -op Vital signs reviewed and stable and Patient moving all extremities  Post vital signs: Reviewed and stable  Last Vitals:  Vitals Value Taken Time  BP    Temp    Pulse    Resp    SpO2      Last Pain:  Vitals:   01/15/18 1111  TempSrc:   PainSc: 0-No pain      Patients Stated Pain Goal: 7 (01/15/18 1111)  Complications: No apparent anesthesia complications

## 2018-01-15 NOTE — H&P (Signed)
Preoperative History and Physical  Melinda MerlinMonica Y Crumpacker is a 37 y.o. [id number] with Patient's last menstrual period was 11/15/2017. admitted for a   Subjective [name] is here for follow up of heavy prolonged vaginal bleeding She received IV premarin and given depo provera IM plus transfused PRBC and she has stopped bleeding Sonogram is normal I am following her up from her hospital admission Will proceed with endometrial ablation  PMH:    Past Medical History:  Diagnosis Date  . Diastolic dysfunction 09/28/2014   Grade 2. Mod LVH. EF 60%  . GERD (gastroesophageal reflux disease)   . Hypertension   . Hypertensive urgency 09/26/2014  . 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 MRI brain and C-spine unremarkable  . Microcytic anemia 09/27/2014   Heavy menstrual periods  . Preeclampsia     x3    PSH:     Past Surgical History:  Procedure Laterality Date  . c-sections    . CESAREAN SECTION     x 3  . TUBAL LIGATION     2012    POb/GynH:      OB History    Gravida  3   Para  3   Term  2   Preterm  1   AB      Living  2     SAB      TAB      Ectopic      Multiple      Live Births  2           SH:   Social History   Tobacco Use  . Smoking status: Current Every Day Smoker    Packs/day: 0.25    Years: 10.00    Pack years: 2.50    Types: Cigarettes  . Smokeless tobacco: Never Used  Substance Use Topics  . Alcohol use: Not Currently  . Drug use: Never    FH:    Family History  Problem Relation Age of Onset  . Hypertension Mother   . Diabetes Mother   . Anemia Mother   . Hypertension Father   . Diabetes Father   . Heart attack Paternal Grandfather   . Hypertension Paternal Grandmother   . Diabetes Paternal Grandmother   . Cancer Maternal Grandmother   . Alcohol abuse Maternal Grandfather   . Anemia Sister   . Hypertension Sister      Allergies: No Known Allergies  Medications:       Current Facility-Administered  Medications:  .  ceFAZolin (ANCEF) IVPB 2g/100 mL premix, 2 g, Intravenous, On Call to OR, Lazaro ArmsEure, Bernarr Longsworth H, MD .  lactated ringers infusion, , Intravenous, Continuous, Cameransi, Candis SchatzBenjamin G, MD, Last Rate: 50 mL/hr at 01/15/18 1109  Review of Systems:   Review of Systems  Constitutional: Negative for fever, chills, weight loss, malaise/fatigue and diaphoresis.  HENT: Negative for hearing loss, ear pain, nosebleeds, congestion, sore throat, neck pain, tinnitus and ear discharge.   Eyes: Negative for blurred vision, double vision, photophobia, pain, discharge and redness.  Respiratory: Negative for cough, hemoptysis, sputum production, shortness of breath, wheezing and stridor.   Cardiovascular: Negative for chest pain, palpitations, orthopnea, claudication, leg swelling and PND.  Gastrointestinal: Positive for abdominal pain. Negative for heartburn, nausea, vomiting, diarrhea, constipation, blood in stool and melena.  Genitourinary: Negative for dysuria, urgency, frequency, hematuria and flank pain.  Musculoskeletal: Negative for myalgias, back pain, joint pain and falls.  Skin: Negative for itching and rash.  Neurological: Negative for dizziness, tingling, tremors, sensory change, speech change, focal weakness, seizures, loss of consciousness, weakness and headaches.  Endo/Heme/Allergies: Negative for environmental allergies and polydipsia. Does not bruise/bleed easily.  Psychiatric/Behavioral: Negative for depression, suicidal ideas, hallucinations, memory loss and substance abuse. The patient is not nervous/anxious and does not have insomnia.      PHYSICAL EXAM:  Blood pressure (!) 131/93, pulse 76, temperature 98.9 F (37.2 C), temperature source Oral, last menstrual period 11/15/2017, SpO2 98 %.    Vitals reviewed. Constitutional: She is oriented to person, place, and time. She appears well-developed and well-nourished.  HENT:  Head: Normocephalic and atraumatic.  Right Ear:  External ear normal.  Left Ear: External ear normal.  Nose: Nose normal.  Mouth/Throat: Oropharynx is clear and moist.  Eyes: Conjunctivae and EOM are normal. Pupils are equal, round, and reactive to light. Right eye exhibits no discharge. Left eye exhibits no discharge. No scleral icterus.  Neck: Normal range of motion. Neck supple. No tracheal deviation present. No thyromegaly present.  Cardiovascular: Normal rate, regular rhythm, normal heart sounds and intact distal pulses.  Exam reveals no gallop and no friction rub.   No murmur heard. Respiratory: Effort normal and breath sounds normal. No respiratory distress. She has no wheezes. She has no rales. She exhibits no tenderness.  GI: Soft. Bowel sounds are normal. She exhibits no distension and no mass. There is tenderness. There is no rebound and no guarding.  Genitourinary:       Vulva is normal without lesions Vagina is pink moist without discharge Cervix normal in appearance and pap is normal Uterus is normal size, contour, position, consistency, mobility, non-tender Adnexa is negative with normal sized ovaries by sonogram  Musculoskeletal: Normal range of motion. She exhibits no edema and no tenderness.  Neurological: She is alert and oriented to person, place, and time. She has normal reflexes. She displays normal reflexes. No cranial nerve deficit. She exhibits normal muscle tone. Coordination normal.  Skin: Skin is warm and dry. No rash noted. No erythema. No pallor.  Psychiatric: She has a normal mood and affect. Her behavior is normal. Judgment and thought content normal.    Labs: Results for orders placed or performed during the hospital encounter of 01/13/18 (from the past 336 hour(s))  Potassium   Collection Time: 01/13/18  8:11 AM  Result Value Ref Range   Potassium 2.8 (L) 3.5 - 5.1 mmol/L  hCG, serum, qualitative (Not at [facility])   Collection Time: 01/13/18  8:11 AM  Result Value Ref Range   Preg, Serum NEGATIVE  NEGATIVE    EKG:  Imaging Studies: CLINICAL DATA:  Menorrhagia and anemia. Heavy periods for 5 years. History of prior C-section. LMP 11/08/2017.  EXAM: TRANSABDOMINAL AND TRANSVAGINAL ULTRASOUND OF PELVIS  TECHNIQUE: Both transabdominal and transvaginal ultrasound examinations of the pelvis were performed. Transabdominal technique was performed for global imaging of the pelvis including uterus, ovaries, adnexal regions, and pelvic cul-de-sac. It was necessary to proceed with endovaginal exam following the transabdominal exam to visualize the endometrium, ovaries.  COMPARISON:  03/12/2008  FINDINGS: Uterus  Measurements: 10.3 x 5.4 x 6.3 centimeters. Multiple nabothian cysts are identified. No suspicious solid mass. C-section scar identified.  Endometrium  Thickness: 8.4 millimeters.  No focal abnormality visualized.  Right ovary  Measurements: 4.0 x 2.7 x 2.5 centimeters. Normal appearance/no adnexal mass.  Left ovary  Measurements: 3.3 x 1.5 x 2.2 centimeters. Normal appearance/no adnexal mass.  Other findings  No abnormal free fluid. Study quality  is degraded by patient body habitus.  IMPRESSION: 1. Normal appearance of the endometrial stripe. If bleeding remains unresponsive to hormonal or medical therapy, sonohysterogram should be considered for focal lesion work-up. (Ref: Radiological Reasoning: Algorithmic Workup of Abnormal Vaginal Bleeding with Endovaginal Sonography and Sonohysterography. AJR 2008; 161:W96-04) 2. Normal appearance of both ovaries.   Electronically Signed   By: Norva Pavlov M.D.   On: 11/15/2017 15:45     Assessment: menorrhagia Chronic blood loss anemia  Plan: Hysteroscopy uterine curettage Minerva endometrial ablation  Lazaro Arms 01/15/2018 11:36 AM

## 2018-01-15 NOTE — Anesthesia Postprocedure Evaluation (Signed)
Anesthesia Post Note  Patient: [name]  Procedure(s) Performed: DILATATION & CURETTAGE/HYSTEROSCOPY WITH MINERVA ABLATION (N/A Uterus)  Patient location during evaluation: PACU Anesthesia Type: General Level of consciousness: awake and alert and patient cooperative Pain management: pain level controlled Vital Signs Assessment: post-procedure vital signs reviewed and stable Respiratory status: spontaneous breathing, nonlabored ventilation and respiratory function stable Cardiovascular status: blood pressure returned to baseline Postop Assessment: no apparent nausea or vomiting Anesthetic complications: no     Last Vitals:  Vitals:   01/15/18 1237 01/15/18 1252  BP: 140/79 (!) 161/87  Pulse:  81  Resp: 16 19  Temp: 36.7 C   SpO2: 100% 98%    Last Pain:  Vitals:   01/15/18 1252  TempSrc:   PainSc: 4                  Manju Kulkarni J

## 2018-01-15 NOTE — Anesthesia Preprocedure Evaluation (Signed)
Anesthesia Evaluation  Patient identified by MRN, date of birth, ID band Patient awake    Reviewed: Allergy & Precautions, H&P , NPO status , Patient's Chart, lab work & pertinent test results, reviewed documented beta blocker date and time   Airway Mallampati: II  TM Distance: >3 FB Neck ROM: full    Dental no notable dental hx. (+) Poor Dentition, Dental Advidsory Given   Pulmonary neg pulmonary ROS, Current Smoker,    Pulmonary exam normal breath sounds clear to auscultation       Cardiovascular Exercise Tolerance: Good hypertension, On Medications negative cardio ROS   Rhythm:regular Rate:Normal     Neuro/Psych negative neurological ROS  negative psych ROS   GI/Hepatic negative GI ROS, Neg liver ROS, GERD  ,  Endo/Other  negative endocrine ROS  Renal/GU negative Renal ROS  negative genitourinary   Musculoskeletal   Abdominal   Peds  Hematology negative hematology ROS (+) anemia ,   Anesthesia Other Findings Hypokalemia with treatment, K today 2.9 obesity  Reproductive/Obstetrics negative OB ROS                             Anesthesia Physical Anesthesia Plan  ASA: II  Anesthesia Plan: General   Post-op Pain Management:    Induction:   PONV Risk Score and Plan:   Airway Management Planned:   Additional Equipment:   Intra-op Plan:   Post-operative Plan:   Informed Consent: I have reviewed the patients History and Physical, chart, labs and discussed the procedure including the risks, benefits and alternatives for the proposed anesthesia with the patient or authorized representative who has indicated his/her understanding and acceptance.   Dental Advisory Given  Plan Discussed with: CRNA and Anesthesiologist  Anesthesia Plan Comments:         Anesthesia Quick Evaluation

## 2018-01-15 NOTE — Op Note (Signed)
Preoperative diagnosis:  1.   menotrorrhagia                                         2.  Anemia due to chronic blood loss   Postoperative diagnoses: Same as above   Procedure: Hysteroscopy, uterine curettage, endometrial ablation using Minerva  Surgeon: Lazaro ArmsLuther H Immanuel Fedak   Anesthesia: Laryngeal mask airway  Findings: The endometrium was normal. There were no fibroid or other abnormalities.  Description of operation: The patient was taken to the operating room and placed in the supine position. She underwent general anesthesia using the laryngeal mask airway. She was placed in the dorsal lithotomy position and prepped and draped in the usual sterile fashion. A Graves speculum was placed and the anterior cervical lip was grasped with a single-tooth tenaculum. The cervix was dilated serially to allow passage of the hysteroscope. Diagnostic hysteroscopy was performed and was found to be normal. A vigorous uterine curettage was then performed and all tissue sent to pathology for evaluation.  I then proceeded to perform the Minerva endometrial ablation.   The uterus sounded to 9.0 cm The handpiece was attached to the Minerva power source/machine and the handpiece passed the checklist. The array was squeezed down to remove all of the air present.  The array was then place into the endometrial cavity and deployed to a length of 6.0 cm. The handpiece confirmed appropriate width by being in the green portion of the visual dial. The cervical cuff was then inflated to the point the CO2 indicator was in the green. The endometrial integrity check was then performed and integrity sequence was confirmed x 2. The heating was then begun and carried out for a total of 2 minutes(which is standard therapy time). When the plasma cycle was finished,  the cervical cuff was deflated and the array was removed with tissue present on the silicon membrane. There was appropriate post Minerva bleeding and uterine  discharge.     All of the equipment worked well throughout the procedure.  The patient was awakened from anesthesia and taken to the recovery room in good stable condition all counts were correct. She received 2 g of Ancef and 30 mg of Toradol preoperatively. She will be discharged from the recovery room and followed up in the office in 1- 2 weeks.   She can expect 4 weeks of post procedure bloody watery discharge  Lazaro ArmsLuther H Nelwyn Hebdon, MD   01/15/2018 12:29 PM

## 2018-01-15 NOTE — Discharge Instructions (Signed)
Endometrial Ablation Endometrial ablation is a procedure that destroys the thin inner layer of the lining of the uterus (endometrium). This procedure may be done:  To stop heavy periods.  To stop bleeding that is causing anemia.  To control irregular bleeding.  To treat bleeding caused by small tumors (fibroids) in the endometrium.  This procedure is often an alternative to major surgery, such as removal of the uterus and cervix (hysterectomy). As a result of this procedure:  You may not be able to have children. However, if you are premenopausal (you have not gone through menopause): ? You may still have a small chance of getting pregnant. ? You will need to use a reliable method of birth control after the procedure to prevent pregnancy.  You may stop having a menstrual period, or you may have only a small amount of bleeding during your period. Menstruation may return several years after the procedure.  Tell a health care provider about:  Any allergies you have.  All medicines you are taking, including vitamins, herbs, eye drops, creams, and over-the-counter medicines.  Any problems you or family members have had with the use of anesthetic medicines.  Any blood disorders you have.  Any surgeries you have had.  Any medical conditions you have. What are the risks? Generally, this is a safe procedure. However, problems may occur, including:  A hole (perforation) in the uterus or bowel.  Infection of the uterus, bladder, or vagina.  Bleeding.  Damage to other structures or organs.  An air bubble in the lung (air embolus).  Problems with pregnancy after the procedure.  Failure of the procedure.  Decreased ability to diagnose cancer in the endometrium.  What happens before the procedure?  You will have tests of your endometrium to make sure there are no pre-cancerous cells or cancer cells present.  You may have an ultrasound of the uterus.  You may be given  medicines to thin the endometrium.  Ask your health care provider about: ? Changing or stopping your regular medicines. This is especially important if you take diabetes medicines or blood thinners. ? Taking medicines such as aspirin and ibuprofen. These medicines can thin your blood. Do not take these medicines before your procedure if your doctor tells you not to.  Plan to have someone take you home from the hospital or clinic. What happens during the procedure?  You will lie on an exam table with your feet and legs supported as in a pelvic exam.  To lower your risk of infection: ? Your health care team will wash or sanitize their hands and put on germ-free (sterile) gloves. ? Your genital area will be washed with soap.  An IV tube will be inserted into one of your veins.  You will be given a medicine to help you relax (sedative).  A surgical instrument with a light and camera (resectoscope) will be inserted into your vagina and moved into your uterus. This allows your surgeon to see inside your uterus.  Endometrial tissue will be removed using one of the following methods: ? Radiofrequency. This method uses a radiofrequency-alternating electric current to remove the endometrium. ? Cryotherapy. This method uses extreme cold to freeze the endometrium. ? Heated-free liquid. This method uses a heated saltwater (saline) solution to remove the endometrium. ? Microwave. This method uses high-energy microwaves to heat up the endometrium and remove it. ? Thermal balloon. This method involves inserting a catheter with a balloon tip into the uterus. The balloon tip is   filled with heated fluid to remove the endometrium. The procedure may vary among health care providers and hospitals. What happens after the procedure?  Your blood pressure, heart rate, breathing rate, and blood oxygen level will be monitored until the medicines you were given have worn off.  As tissue healing occurs, you may  notice vaginal bleeding for 4-6 weeks after the procedure. You may also experience: ? Cramps. ? Thin, watery vaginal discharge that is light pink or brown in color. ? A need to urinate more frequently than usual. ? Nausea.  Do not drive for 24 hours if you were given a sedative.  Do not have sex or insert anything into your vagina until your health care provider approves. Summary  Endometrial ablation is done to treat the many causes of heavy menstrual bleeding.  The procedure may be done only after medications have been tried to control the bleeding.  Plan to have someone take you home from the hospital or clinic. This information is not intended to replace advice given to you by your health care provider. Make sure you discuss any questions you have with your health care provider. Document Released: 03/23/2004 Document Revised: 05/31/2016 Document Reviewed: 05/31/2016 Elsevier Interactive Patient Education  2017 [employer]  

## 2018-01-15 NOTE — Anesthesia Postprocedure Evaluation (Signed)
Anesthesia Post Note  Patient: [name]  Procedure(s) Performed: DILATATION & CURETTAGE/HYSTEROSCOPY WITH MINERVA ABLATION (N/A Uterus)  Anesthesia Type: General Level of consciousness: awake and alert Pain management: pain level controlled Vital Signs Assessment: post-procedure vital signs reviewed and stable Respiratory status: spontaneous breathing, nonlabored ventilation and respiratory function stable Cardiovascular status: blood pressure returned to baseline Postop Assessment: no apparent nausea or vomiting Anesthetic complications: no     Last Vitals:  Vitals:   01/15/18 1013 01/15/18 1237  BP: (!) 131/93 140/79  Pulse: 76   Resp:  16  Temp: 37.2 C 36.7 C  SpO2: 98% 100%    Last Pain:  Vitals:   01/15/18 1237  TempSrc:   PainSc: 7                  Idus Rathke J

## 2018-01-15 NOTE — Anesthesia Postprocedure Evaluation (Deleted)
Anesthesia Post Note  Patient: [name]  Procedure(s) Performed: DILATATION & CURETTAGE/HYSTEROSCOPY WITH MINERVA ABLATION (N/A Uterus)  Patient location during evaluation: Short Stay Anesthesia Type: General Level of consciousness: awake and alert and patient cooperative Pain management: satisfactory to patient Vital Signs Assessment: post-procedure vital signs reviewed and stable Respiratory status: spontaneous breathing Cardiovascular status: stable Postop Assessment: no apparent nausea or vomiting Anesthetic complications: no Comments: Talked with patient about missing lower tooth.OR 4  Suction and linens checked for tooth. No tooth found. Discussed with patient the possibility she swallowed tooth. Patient stated understanding. States she is having a bridge made next month. Upper right loose incisor remains intact.     Last Vitals:  Vitals:   01/15/18 1013  BP: (!) 131/93  Pulse: 76  Temp: 37.2 C  SpO2: 98%    Last Pain:  Vitals:   01/15/18 1111  TempSrc:   PainSc: 0-No pain                 Ling Flesch

## 2018-01-15 NOTE — Anesthesia Procedure Notes (Signed)
Procedure Name: LMA Insertion Date/Time: 01/15/2018 11:50 AM Performed by: Despina HiddenIdacavage, Philipe Laswell J, CRNA Pre-anesthesia Checklist: Patient identified, Emergency Drugs available, Timeout performed, Suction available and Patient being monitored Patient Re-evaluated:Patient Re-evaluated prior to induction Oxygen Delivery Method: Circle system utilized Preoxygenation: Pre-oxygenation with 100% oxygen Induction Type: IV induction Ventilation: Mask ventilation without difficulty Number of attempts: 1 Placement Confirmation: positive ETCO2,  CO2 detector and breath sounds checked- equal and bilateral Tube secured with: Tape Dental Injury: Teeth and Oropharynx as per pre-operative assessment

## 2018-01-15 NOTE — Progress Notes (Signed)
Please Excuse [name] from work today 01-15-2018.  She is with her daughter who cannot drive, operate heavy machinery,  sign legal documents  or be left alone for 24 hours.

## 2018-01-16 ENCOUNTER — Encounter (HOSPITAL_COMMUNITY): Payer: Self-pay | Admitting: Obstetrics & Gynecology

## 2018-06-02 ENCOUNTER — Other Ambulatory Visit: Payer: Self-pay

## 2018-06-02 ENCOUNTER — Encounter (HOSPITAL_COMMUNITY): Payer: Self-pay | Admitting: *Deleted

## 2018-06-02 ENCOUNTER — Emergency Department (HOSPITAL_COMMUNITY)
Admission: EM | Admit: 2018-06-02 | Discharge: 2018-06-02 | Disposition: A | Discharge status: Home / Self Care | Payer: Medicaid Other | Attending: Emergency Medicine | Admitting: Emergency Medicine

## 2018-06-02 DIAGNOSIS — I10 Essential (primary) hypertension: Secondary | ICD-10-CM | POA: Insufficient documentation

## 2018-06-02 DIAGNOSIS — F17210 Nicotine dependence, cigarettes, uncomplicated: Secondary | ICD-10-CM | POA: Insufficient documentation

## 2018-06-02 DIAGNOSIS — R030 Elevated blood-pressure reading, without diagnosis of hypertension: Secondary | ICD-10-CM

## 2018-06-02 DIAGNOSIS — R55 Syncope and collapse: Secondary | ICD-10-CM | POA: Insufficient documentation

## 2018-06-02 DIAGNOSIS — F419 Anxiety disorder, unspecified: Secondary | ICD-10-CM | POA: Insufficient documentation

## 2018-06-02 DIAGNOSIS — Z79899 Other long term (current) drug therapy: Secondary | ICD-10-CM | POA: Insufficient documentation

## 2018-06-02 LAB — BASIC METABOLIC PANEL
ANION GAP: 5 (ref 5–15)
BUN: 10 mg/dL (ref 6–20)
CO2: 23 mmol/L (ref 22–32)
Calcium: 9 mg/dL (ref 8.9–10.3)
Chloride: 108 mmol/L (ref 98–111)
Creatinine, Ser: 0.75 mg/dL (ref 0.44–1.00)
Glucose, Bld: 108 mg/dL — ABNORMAL HIGH (ref 70–99)
POTASSIUM: 3.3 mmol/L — AB (ref 3.5–5.1)
SODIUM: 136 mmol/L (ref 135–145)

## 2018-06-02 LAB — CBC
HCT: 39.4 % (ref 36.0–46.0)
HEMOGLOBIN: 12.9 g/dL (ref 12.0–15.0)
MCH: 28.5 pg (ref 26.0–34.0)
MCHC: 32.7 g/dL (ref 30.0–36.0)
MCV: 87 fL (ref 80.0–100.0)
PLATELETS: 255 10*3/uL (ref 150–400)
RBC: 4.53 MIL/uL (ref 3.87–5.11)
RDW: 14.1 % (ref 11.5–15.5)
WBC: 6.7 10*3/uL (ref 4.0–10.5)
nRBC: 0 % (ref 0.0–0.2)

## 2018-06-02 LAB — HCG, QUANTITATIVE, PREGNANCY

## 2018-06-02 MED ORDER — LISINOPRIL-HYDROCHLOROTHIAZIDE 20-25 MG PO TABS: 1.0000 | ORAL_TABLET | Freq: Every day | ORAL | 0 refills | Status: DC

## 2018-06-02 MED ORDER — ALPRAZOLAM 0.25 MG PO TABS: 0.2500 mg | ORAL_TABLET | Freq: Three times a day (TID) | ORAL | 0 refills | Status: DC | PRN

## 2018-06-02 MED ORDER — AMLODIPINE BESYLATE 5 MG PO TABS
10.0000 mg | ORAL_TABLET | Freq: Once | ORAL | Status: AC
  Administered 2018-06-02: 10 mg via ORAL
  Filled 2018-06-02: qty 2

## 2018-06-02 MED ORDER — LISINOPRIL 10 MG PO TABS
20.0000 mg | ORAL_TABLET | Freq: Once | ORAL | Status: AC
  Administered 2018-06-02: 20 mg via ORAL
  Filled 2018-06-02: qty 2

## 2018-06-02 MED ORDER — AMLODIPINE BESYLATE 10 MG PO TABS: 10.0000 mg | ORAL_TABLET | Freq: Every day | ORAL | 0 refills | Status: DC

## 2018-06-02 NOTE — ED Notes (Signed)
Pt reports her last menstrual cycle was last October and recently has been experiencing episodes of feeling dizzy and light-headed.

## 2018-06-02 NOTE — ED Provider Notes (Signed)
[facility] EMERGENCY DEPARTMENT Provider Note   CSN: [id number] Arrival date & time: 06/02/18  [id number]     History   Chief Complaint Chief Complaint  Patient presents with  . Weakness    HPI [name] is a 38 y.o. female.  HPI Patient states this afternoon she stood up and had episode of lightheadedness.  She had tunnel vision associated with nausea.  This made her very anxious.  No loss of consciousness.  Currently denying any symptoms.  Denies headache, visual changes, focal weakness or numbness.  Patient states she is been out of her blood pressure medication for several months.  Denies gastrointestinal or vaginal bleeding. Past Medical History:  Diagnosis Date  . Diastolic dysfunction 09/28/2014   Grade 2. Mod LVH. EF 60%  . GERD (gastroesophageal reflux disease)   . Hypertension   . Hypertensive urgency 09/26/2014  . 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 MRI brain and C-spine unremarkable  . Microcytic anemia 09/27/2014   Heavy menstrual periods  . Preeclampsia     x3    Patient Active Problem List   Diagnosis Date Noted  . 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 Menorrhagia 11/14/2017  . Acute on chronic blood loss anemia 11/14/2017  . Hypokalemia 11/14/2017  . Hypertension 10/01/2014  . Anemia 10/01/2014  . Anxiety and depression 10/01/2014  . Diastolic dysfunction 09/29/2014  . Syncope 09/27/2014  . Left sided numbness 09/27/2014  . Chest pressure 09/27/2014  . Microcytic anemia 09/27/2014  . Hypertensive urgency 09/26/2014  . TIA (transient ischemic attack) 09/26/2014  . Tobacco abuse 09/26/2014  . 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 Preeclampsia     Past Surgical History:  Procedure Laterality Date  . c-sections    . CESAREAN SECTION     x 3  . DILITATION & CURRETTAGE/HYSTROSCOPY WITH NOVASURE ABLATION N/A 01/15/2018   Procedure: DILATATION & CURETTAGE/HYSTEROSCOPY WITH MINERVA ABLATION;  Surgeon: Lazaro ArmsEure, Luther H, MD;   Location: [facility];  Service: Gynecology;  Laterality: N/A;  . TUBAL LIGATION     2012     OB History    Gravida  3   Para  3   Term  2   Preterm  1   AB      Living  2     SAB      TAB      Ectopic      Multiple      Live Births  2            Home Medications    Prior to Admission medications   Medication Sig Start Date End Date Taking? Authorizing Provider  ALPRAZolam (XANAX) 0.25 MG tablet Take 1 tablet (0.25 mg total) by mouth 3 (three) times daily as needed for anxiety. 06/02/18   Loren RacerYelverton, Asante Blanda, MD  amLODipine (NORVASC) 10 MG tablet Take 1 tablet (10 mg total) by mouth daily. 06/02/18   Loren RacerYelverton, Donda Friedli, MD  folic acid (FOLVITE) 400 MCG tablet Take 400 mcg by mouth daily.    [provider]  HYDROcodone-acetaminophen (NORCO/VICODIN) 5-325 MG tablet Take 1 tablet by mouth every 6 (six) hours as needed. 01/15/18   Lazaro ArmsEure, Luther H, MD  ketorolac (TORADOL) 10 MG tablet Take 1 tablet (10 mg total) by mouth every 8 (eight) hours as needed. 01/15/18   Lazaro ArmsEure, Luther H, MD  lisinopril-hydrochlorothiazide (PRINZIDE,ZESTORETIC) 20-25 MG tablet Take 1 tablet by mouth daily. 06/02/18   Loren RacerYelverton, Shalaine Payson, MD  ondansetron (ZOFRAN ODT) 8  MG disintegrating tablet Take 1 tablet (8 mg total) by mouth every 8 (eight) hours as needed for nausea or vomiting. 01/15/18   Lazaro ArmsEure, Luther H, MD  potassium chloride 20 MEQ TBCR Take 20 mEq by mouth daily. Patient taking differently: Take 20 mEq by mouth 3 (three) times daily.  01/01/18   Lazaro ArmsEure, Luther H, MD    Family History Family History  Problem Relation Age of Onset  . Hypertension Mother   . Diabetes Mother   . Anemia Mother   . Hypertension Father   . Diabetes Father   . Heart attack Paternal Grandfather   . Hypertension Paternal Grandmother   . Diabetes Paternal Grandmother   . Cancer Maternal Grandmother   . Alcohol abuse Maternal Grandfather   . Anemia Sister   . Hypertension Sister     Social History Social History    Tobacco Use  . Smoking status: Current Every Day Smoker    Packs/day: 0.25    Years: 10.00    Pack years: 2.50    Types: Cigarettes  . Smokeless tobacco: Never Used  Substance Use Topics  . Alcohol use: Not Currently  . Drug use: Never     Allergies   Patient has no known allergies.   Review of Systems Review of Systems  Constitutional: Negative for chills and fever.  HENT: Negative for sore throat and trouble swallowing.   Eyes: Negative for visual disturbance.  Respiratory: Negative for cough and shortness of breath.   Cardiovascular: Positive for palpitations. Negative for chest pain.  Gastrointestinal: Positive for nausea. Negative for abdominal pain, diarrhea and vomiting.  Genitourinary: Negative for dysuria, flank pain, frequency, hematuria, pelvic pain and vaginal bleeding.  Musculoskeletal: Negative for back pain, myalgias and neck pain.  Skin: Negative for rash and wound.  Neurological: Positive for dizziness and light-headedness. Negative for syncope, weakness, numbness and headaches.  Psychiatric/Behavioral: The patient is nervous/anxious.   All other systems reviewed and are negative.    Physical Exam Updated Vital Signs BP (!) 165/106   Pulse 83   Temp 98 F (36.7 C) (Oral)   Resp 18   Ht 5\' 5"  (1.651 m)   Wt 99.8 kg   SpO2 100%   BMI 36.61 kg/m   Physical Exam Vitals signs and nursing note reviewed.  Constitutional:      General: She is not in acute distress.    Appearance: Normal appearance. She is well-developed. She is not ill-appearing.  HENT:     Head: Normocephalic and atraumatic.     Nose: Nose normal. No congestion or rhinorrhea.     Mouth/Throat:     Mouth: Mucous membranes are moist.     Pharynx: No oropharyngeal exudate or posterior oropharyngeal erythema.  Eyes:     Extraocular Movements: Extraocular movements intact.     Conjunctiva/sclera: Conjunctivae normal.     Pupils: Pupils are equal, round, and reactive to light.   Neck:     Musculoskeletal: Normal range of motion and neck supple. No neck rigidity or muscular tenderness.  Cardiovascular:     Rate and Rhythm: Normal rate and regular rhythm.     Heart sounds: No murmur. No friction rub. No gallop.   Pulmonary:     Effort: Pulmonary effort is normal. No respiratory distress.     Breath sounds: Normal breath sounds. No stridor. No wheezing, rhonchi or rales.  Chest:     Chest wall: No tenderness.  Abdominal:     General: Bowel sounds are normal. There  is no distension.     Palpations: Abdomen is soft.     Tenderness: There is no abdominal tenderness. There is no guarding or rebound.  Musculoskeletal: Normal range of motion.        General: No tenderness.     Comments: No lower extremity swelling, asymmetry or tenderness.  No midline thoracic or lumbar tenderness.  No CVA tenderness.  Distal pulses intact.  Lymphadenopathy:     Cervical: No cervical adenopathy.  Skin:    General: Skin is warm and dry.     Findings: No erythema or rash.  Neurological:     General: No focal deficit present.     Mental Status: She is alert and oriented to person, place, and time.     Comments: Patient is alert and oriented x3 with clear, goal oriented speech. Patient has 5/5 motor in all extremities. Sensation is intact to light touch.   Psychiatric:        Behavior: Behavior normal.      ED Treatments / Results  Labs (all labs ordered are listed, but only abnormal results are displayed) Labs Reviewed  BASIC METABOLIC PANEL - Abnormal; Notable for the following components:      Result Value   Potassium 3.3 (*)    Glucose, Bld 108 (*)    All other components within normal limits  CBC  HCG, QUANTITATIVE, PREGNANCY    EKG EKG Interpretation  Date/Time:  Monday June 02 2018 19:23:51 EST Ventricular Rate:  103 PR Interval:  132 QRS Duration: 80 QT Interval:  374 QTC Calculation: 489 R Axis:   64 Text Interpretation:  Sinus tachycardia Possible  Left atrial enlargement Nonspecific T wave abnormality Abnormal ECG Since last tracing rate faster Confirmed by Mancel Bale [phone]) on 06/02/2018 8:16:49 PM   Radiology No results found.  Procedures Procedures (including critical care time)  Medications Ordered in ED Medications  amLODipine (NORVASC) tablet 10 mg (10 mg Oral Given 06/02/18 2130)  lisinopril (PRINIVIL,ZESTRIL) tablet 20 mg (20 mg Oral Given 06/02/18 2130)     Initial Impression / Assessment and Plan / ED Course  I have reviewed the triage vital signs and the nursing notes.  Pertinent labs & imaging results that were available during my care of the patient were reviewed by me and considered in my medical decision making (see chart for details).     Blood pressure is improved.  Patient denies current symptoms.  EKG without acute changes.  Suspect patient's near syncopal symptoms related to uncontrolled blood pressure and anxiety.  Strict return precautions have been given.  Blood pressure medications have been refilled.  She has been urged to establish care with a primary physician to monitor blood pressure.   Final Clinical Impressions(s) / ED Diagnoses   Final diagnoses:  Near syncope  Elevated blood pressure reading  Anxiety    ED Discharge Orders         Ordered    lisinopril-hydrochlorothiazide (PRINZIDE,ZESTORETIC) 20-25 MG tablet  Daily     06/02/18 2241    amLODipine (NORVASC) 10 MG tablet  Daily     06/02/18 2241    ALPRAZolam (XANAX) 0.25 MG tablet  3 times daily PRN     06/02/18 2241           Loren Racer, MD 06/02/18 2243

## 2018-06-02 NOTE — Discharge Instructions (Signed)
Establish care with primary physician to manage her blood pressure.  Take medications as prescribed.  Change positions slowly.

## 2018-06-02 NOTE — ED Triage Notes (Signed)
Pt states that she was at home today and felt like she was going to "pass out" pt reports that she had the same symptoms two months ago when her blood count was low but she had an ablation and everything was better until tonight,

## 2018-06-03 ENCOUNTER — Encounter (HOSPITAL_COMMUNITY): Payer: Self-pay | Admitting: *Deleted

## 2018-06-03 ENCOUNTER — Emergency Department (HOSPITAL_COMMUNITY)
Admission: EM | Admit: 2018-06-03 | Discharge: 2018-06-03 | Disposition: A | Discharge status: Home / Self Care | Payer: Self-pay | Attending: Emergency Medicine | Admitting: Emergency Medicine

## 2018-06-03 ENCOUNTER — Other Ambulatory Visit: Payer: Self-pay

## 2018-06-03 ENCOUNTER — Emergency Department (HOSPITAL_COMMUNITY): Payer: Self-pay

## 2018-06-03 DIAGNOSIS — Z79899 Other long term (current) drug therapy: Secondary | ICD-10-CM | POA: Insufficient documentation

## 2018-06-03 DIAGNOSIS — F17210 Nicotine dependence, cigarettes, uncomplicated: Secondary | ICD-10-CM | POA: Insufficient documentation

## 2018-06-03 DIAGNOSIS — R002 Palpitations: Secondary | ICD-10-CM | POA: Insufficient documentation

## 2018-06-03 DIAGNOSIS — R42 Dizziness and giddiness: Secondary | ICD-10-CM

## 2018-06-03 DIAGNOSIS — I10 Essential (primary) hypertension: Secondary | ICD-10-CM | POA: Insufficient documentation

## 2018-06-03 DIAGNOSIS — R51 Headache: Secondary | ICD-10-CM | POA: Insufficient documentation

## 2018-06-03 DIAGNOSIS — R55 Syncope and collapse: Secondary | ICD-10-CM | POA: Insufficient documentation

## 2018-06-03 LAB — I-STAT TROPONIN, ED: TROPONIN I, POC: 0 ng/mL (ref 0.00–0.08)

## 2018-06-03 MED ORDER — LORAZEPAM 1 MG PO TABS
1.0000 mg | ORAL_TABLET | Freq: Once | ORAL | Status: AC
  Administered 2018-06-03: 1 mg via ORAL
  Filled 2018-06-03: qty 1

## 2018-06-03 NOTE — ED Triage Notes (Signed)
Pt states she was seen here for same complaint a couple of hours ago; pt states she still feels "faint" and states the feeling came while she was lying in bed; pt states she feels like the back of her neck is hot

## 2018-06-03 NOTE — Discharge Instructions (Signed)
Continue your medications as represcribed this evening.  Follow-up with your primary doctor in the next week, and return to the emergency department if symptoms significantly worsen or change.

## 2018-06-03 NOTE — ED Provider Notes (Signed)
[facility] EMERGENCY DEPARTMENT Provider Note   CSN: [id number] Arrival date & time: 06/03/18  0151     History   Chief Complaint Chief Complaint  Patient presents with  . Dizziness    HPI [name] is a 38 y.o. female.  Patient is a 38 year old female with history of hypertension, anemia.  She presents today for evaluation of weakness.  She was seen earlier today with similar complaints.  Her laboratory studies were unremarkable and she was discharged.  She had been off of her blood pressure medicines for nearly a month and her blood pressure was running high.  She was given doses of her medication and discharged.  Shortly after arriving home, she attempted to take a bath.  At that time, she became lightheaded and as if she was going to pass out.  She reported a pressure in the back of her head and her heart began to race.  The history is provided by the patient.  Dizziness  Quality:  Lightheadedness Severity:  Moderate Onset quality:  Sudden Timing:  Constant Progression:  Unchanged Chronicity:  Recurrent Relieved by:  Nothing Worsened by:  Nothing   Past Medical History:  Diagnosis Date  . Diastolic dysfunction 09/28/2014   Grade 2. Mod LVH. EF 60%  . GERD (gastroesophageal reflux disease)   . Hypertension   . Hypertensive urgency 09/26/2014  . 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 MRI brain and C-spine unremarkable  . Microcytic anemia 09/27/2014   Heavy menstrual periods  . Preeclampsia     x3    Patient Active Problem List   Diagnosis Date Noted  . 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 Menorrhagia 11/14/2017  . Acute on chronic blood loss anemia 11/14/2017  . Hypokalemia 11/14/2017  . Hypertension 10/01/2014  . Anemia 10/01/2014  . Anxiety and depression 10/01/2014  . Diastolic dysfunction 09/29/2014  . Syncope 09/27/2014  . Left sided numbness 09/27/2014  . Chest pressure 09/27/2014  . Microcytic anemia 09/27/2014  .  Hypertensive urgency 09/26/2014  . TIA (transient ischemic attack) 09/26/2014  . Tobacco abuse 09/26/2014  . 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 Preeclampsia     Past Surgical History:  Procedure Laterality Date  . c-sections    . CESAREAN SECTION     x 3  . DILITATION & CURRETTAGE/HYSTROSCOPY WITH NOVASURE ABLATION N/A 01/15/2018   Procedure: DILATATION & CURETTAGE/HYSTEROSCOPY WITH MINERVA ABLATION;  Surgeon: Lazaro Arms, MD;  Location: [facility];  Service: Gynecology;  Laterality: N/A;  . TUBAL LIGATION     2012     OB History    Gravida  3   Para  3   Term  2   Preterm  1   AB      Living  2     SAB      TAB      Ectopic      Multiple      Live Births  2            Home Medications    Prior to Admission medications   Medication Sig Start Date End Date Taking? Authorizing Provider  ALPRAZolam (XANAX) 0.25 MG tablet Take 1 tablet (0.25 mg total) by mouth 3 (three) times daily as needed for anxiety. 06/02/18   Loren Racer, MD  amLODipine (NORVASC) 10 MG tablet Take 1 tablet (10 mg total) by mouth daily. 06/02/18   Loren Racer, MD  folic acid (FOLVITE) 400 MCG tablet Take  400 mcg by mouth daily.    [provider]  HYDROcodone-acetaminophen (NORCO/VICODIN) 5-325 MG tablet Take 1 tablet by mouth every 6 (six) hours as needed. 01/15/18   Lazaro ArmsEure, Luther H, MD  ketorolac (TORADOL) 10 MG tablet Take 1 tablet (10 mg total) by mouth every 8 (eight) hours as needed. 01/15/18   Lazaro ArmsEure, Luther H, MD  lisinopril-hydrochlorothiazide (PRINZIDE,ZESTORETIC) 20-25 MG tablet Take 1 tablet by mouth daily. 06/02/18   Loren RacerYelverton, David, MD  ondansetron (ZOFRAN ODT) 8 MG disintegrating tablet Take 1 tablet (8 mg total) by mouth every 8 (eight) hours as needed for nausea or vomiting. 01/15/18   Lazaro ArmsEure, Luther H, MD  potassium chloride 20 MEQ TBCR Take 20 mEq by mouth daily. Patient taking differently: Take 20 mEq by mouth 3 (three) times daily.  01/01/18   Lazaro ArmsEure, Luther  H, MD    Family History Family History  Problem Relation Age of Onset  . Hypertension Mother   . Diabetes Mother   . Anemia Mother   . Hypertension Father   . Diabetes Father   . Heart attack Paternal Grandfather   . Hypertension Paternal Grandmother   . Diabetes Paternal Grandmother   . Cancer Maternal Grandmother   . Alcohol abuse Maternal Grandfather   . Anemia Sister   . Hypertension Sister     Social History Social History   Tobacco Use  . Smoking status: Current Every Day Smoker    Packs/day: 0.25    Years: 10.00    Pack years: 2.50    Types: Cigarettes  . Smokeless tobacco: Never Used  Substance Use Topics  . Alcohol use: Not Currently  . Drug use: Never     Allergies   Patient has no known allergies.   Review of Systems Review of Systems  Neurological: Positive for dizziness.  All other systems reviewed and are negative.    Physical Exam Updated Vital Signs BP (!) 189/116 (BP Location: Right Arm)   Pulse (!) 104   Temp 98.9 F (37.2 C) (Oral)   Resp 20   Ht 5\' 5"  (1.651 m)   Wt 99.8 kg   SpO2 100%   BMI 36.61 kg/m   Physical Exam Vitals signs and nursing note reviewed.  Constitutional:      General: She is not in acute distress.    Appearance: She is well-developed. She is not diaphoretic.  HENT:     Head: Normocephalic and atraumatic.  Eyes:     Extraocular Movements: Extraocular movements intact.     Pupils: Pupils are equal, round, and reactive to light.  Neck:     Musculoskeletal: Normal range of motion and neck supple.  Cardiovascular:     Rate and Rhythm: Normal rate and regular rhythm.     Heart sounds: No murmur. No friction rub. No gallop.   Pulmonary:     Effort: Pulmonary effort is normal. No respiratory distress.     Breath sounds: Normal breath sounds. No wheezing.  Abdominal:     General: Bowel sounds are normal. There is no distension.     Palpations: Abdomen is soft.     Tenderness: There is no abdominal  tenderness.  Musculoskeletal: Normal range of motion.  Skin:    General: Skin is warm and dry.  Neurological:     General: No focal deficit present.     Mental Status: She is alert. She is disoriented.     Cranial Nerves: No cranial nerve deficit.     Motor: No weakness.  Coordination: Coordination normal.      ED Treatments / Results  Labs (all labs ordered are listed, but only abnormal results are displayed) Labs Reviewed  I-STAT TROPONIN, ED    EKG EKG Interpretation  Date/Time:  Tuesday June 03 2018 02:58:56 EST Ventricular Rate:  88 PR Interval:    QRS Duration: 81 QT Interval:  352 QTC Calculation: 426 R Axis:   73 Text Interpretation:  Sinus rhythm Consider left atrial enlargement Probable LVH with secondary repol abnrm No significant change since 06/02/2018 Confirmed by Geoffery LyonseLo, Mayana Irigoyen ([id number]) on 06/03/2018 3:07:20 AM   Radiology No results found.  Procedures Procedures (including critical care time)  Medications Ordered in ED Medications - No data to display   Initial Impression / Assessment and Plan / ED Course  I have reviewed the triage vital signs and the nursing notes.  Pertinent labs & imaging results that were available during my care of the patient were reviewed by me and considered in my medical decision making (see chart for details).  Patient just discharged from here several hours ago after work-up for weakness was unremarkable.  She returns, this time with an episode of palpitations, pain in her head, and feeling as if she was going to pass out.  When she arrived here, she was tearful and appeared very anxious.  Her blood pressure was initially markedly elevated, however improved significantly without intervention.  Her head CT is negative, EKG is unchanged, and troponin is 0.  At this point, I see no indication for further work-up.  I am uncertain as to the exact etiology of her episode this evening, however nothing appears emergent.  She did  seem very anxious and anxiety/panic is a possibility.  She will be discharged, to follow-up with her primary doctor if not improving.  Final Clinical Impressions(s) / ED Diagnoses   Final diagnoses:  None    ED Discharge Orders    None       Geoffery Lyonselo, Jocelyn Lowery, MD 06/03/18 [phone]

## 2018-06-20 ENCOUNTER — Emergency Department (HOSPITAL_COMMUNITY)
Admission: EM | Admit: 2018-06-20 | Discharge: 2018-06-20 | Disposition: A | Discharge status: Home / Self Care | Payer: Self-pay | Attending: Emergency Medicine | Admitting: Emergency Medicine

## 2018-06-20 ENCOUNTER — Other Ambulatory Visit: Payer: Self-pay

## 2018-06-20 ENCOUNTER — Encounter (HOSPITAL_COMMUNITY): Payer: Self-pay

## 2018-06-20 DIAGNOSIS — I110 Hypertensive heart disease with heart failure: Secondary | ICD-10-CM | POA: Insufficient documentation

## 2018-06-20 DIAGNOSIS — F419 Anxiety disorder, unspecified: Secondary | ICD-10-CM | POA: Insufficient documentation

## 2018-06-20 DIAGNOSIS — F17210 Nicotine dependence, cigarettes, uncomplicated: Secondary | ICD-10-CM | POA: Insufficient documentation

## 2018-06-20 DIAGNOSIS — R531 Weakness: Secondary | ICD-10-CM | POA: Insufficient documentation

## 2018-06-20 DIAGNOSIS — I5030 Unspecified diastolic (congestive) heart failure: Secondary | ICD-10-CM | POA: Insufficient documentation

## 2018-06-20 DIAGNOSIS — Z8673 Personal history of transient ischemic attack (TIA), and cerebral infarction without residual deficits: Secondary | ICD-10-CM | POA: Insufficient documentation

## 2018-06-20 DIAGNOSIS — Z79899 Other long term (current) drug therapy: Secondary | ICD-10-CM | POA: Insufficient documentation

## 2018-06-20 LAB — URINALYSIS, ROUTINE W REFLEX MICROSCOPIC
BACTERIA UA: NONE SEEN
Bilirubin Urine: NEGATIVE
Glucose, UA: NEGATIVE mg/dL
Ketones, ur: NEGATIVE mg/dL
Leukocytes, UA: NEGATIVE
Nitrite: NEGATIVE
PROTEIN: NEGATIVE mg/dL
Specific Gravity, Urine: 1.019 (ref 1.005–1.030)
pH: 5 (ref 5.0–8.0)

## 2018-06-20 LAB — CBC
HCT: 38.3 % (ref 36.0–46.0)
Hemoglobin: 12.7 g/dL (ref 12.0–15.0)
MCH: 28.5 pg (ref 26.0–34.0)
MCHC: 33.2 g/dL (ref 30.0–36.0)
MCV: 86.1 fL (ref 80.0–100.0)
NRBC: 0 % (ref 0.0–0.2)
Platelets: 262 10*3/uL (ref 150–400)
RBC: 4.45 MIL/uL (ref 3.87–5.11)
RDW: 13.7 % (ref 11.5–15.5)
WBC: 6 10*3/uL (ref 4.0–10.5)

## 2018-06-20 LAB — BASIC METABOLIC PANEL
Anion gap: 9 (ref 5–15)
BUN: 10 mg/dL (ref 6–20)
CO2: 22 mmol/L (ref 22–32)
CREATININE: 0.85 mg/dL (ref 0.44–1.00)
Calcium: 9.3 mg/dL (ref 8.9–10.3)
Chloride: 106 mmol/L (ref 98–111)
GFR calc non Af Amer: >60 mL/min (ref >60)
Glucose, Bld: 134 mg/dL — ABNORMAL HIGH (ref 70–99)
Potassium: 3.1 mmol/L — ABNORMAL LOW (ref 3.5–5.1)
SODIUM: 137 mmol/L (ref 135–145)

## 2018-06-20 LAB — PREGNANCY, URINE: Preg Test, Ur: NEGATIVE

## 2018-06-20 LAB — MAGNESIUM: MAGNESIUM: 1.9 mg/dL (ref 1.7–2.4)

## 2018-06-20 MED ORDER — LORAZEPAM 1 MG PO TABS
1.0000 mg | ORAL_TABLET | Freq: Once | ORAL | Status: AC
  Administered 2018-06-20: 1 mg via ORAL
  Filled 2018-06-20: qty 1

## 2018-06-20 MED ORDER — HYDROXYZINE HCL 25 MG PO TABS: 25.0000 mg | ORAL_TABLET | Freq: Three times a day (TID) | ORAL | 0 refills | Status: DC | PRN

## 2018-06-20 MED ORDER — POTASSIUM CHLORIDE CRYS ER 20 MEQ PO TBCR
40.0000 meq | EXTENDED_RELEASE_TABLET | Freq: Once | ORAL | Status: AC
  Administered 2018-06-20: 40 meq via ORAL
  Filled 2018-06-20: qty 2

## 2018-06-20 NOTE — Discharge Instructions (Signed)
Take the prescription as directed.  Call your regular medical doctor on Monday to schedule a follow up appointment within the next 3 days. Call the mental health resources on Monday to schedule a follow up appointment within the next week.  Return to the Emergency Department immediately sooner if worsening.    Substance Abuse Treatment Programs  Intensive Outpatient Programs [facility]     [address]      [place], [place]                   [phone]       [facility] [address] [place], [place] [phone]  [facility]     (Inpatient and outpatient)     [address]           [phone]    [facility] [phone] (Suboxone and Methadone)  [address]      [place], [place] [address]      [phone]       [address] [place], [place] [phone]  [facility] (Outpatient/Inpatient, Chemical)    (insurance only) [phone]             [facility] (Groups & Residential) [place], [place] [phone]     [facility]     [address]     [place], [place]      [phone]       [facility] (for caregivers and family) [phone] [address] [place], [place] [phone]      Residential Treatment Programs [facility]      [address] [place], [place] [address]  [phone]       [facility] [address], [place], [place] [address] [phone]  [facility] [place]        [phone]       [facility] [phone]  [facility] ([facility]             [address]                                         [place], [place]                                                [phone] or [phone]                               [facility] [place] [address] [place], [address] [phone]  [facility]    [address]      [place],  [place]     [phone]       [facility] [address] [place], [place] [phone]  [facility]   [address] [place], [place] [address]     [phone]      Admissions: 8am-3pm M-F  [facility] ([facility]S) [address] [place], [place] [phone]  [facility] Program: Residential Program [phone] Days)   [place], [place]      [phone] or [phone]     [facility]: [facility] [place], [place] (  Walk in Hours over the weekend or by referral)  [facility] [address] [place], [place], [place] [address] [phone]  Crisis Mobile: [facility]:  [phone] (for crisis response 24 hours a day) [facility] Hotline:      [phone] Outpatient Psychiatry and Counseling  [facility]: Mobile Crisis Management 24 hours:  [phone]  [facility] sliding scale fee and walk in schedule: M-F 8am-12pm/1pm-3pm [address]  [place], [place] [address] [phone]  [facility] [address] [place], [place] [address] [phone]  [facility] ([facility] new patient walk-in appointments available Monday - Friday 8am -3pm.          [address] [place], [place] [address] [phone] or crisis line- [phone]  [facility]/ Intensive Outpatient Therapy Program [address] [place], [place] [address] [phone]  [facility]                  Crisis Services      [phone]      [address]     [place], [place] [address]                 [facility]   [facility] [phone] [address] [place], [place] [address]   [facility]          [address] [place],  [place], [place] [address]       [phone]  [facility] [address] [place], [place] [address] [phone]  [facility]    [address]    [place], [place] [address]     [phone]       Andee PolesParish McKinney, MD     [address]     [place] [address]     [phone]       [facility] [address] [place] [address]  [facility]     [address]     [place], [place]      [phone]       [facility] Eulogio DitchShamsher Ahluwalia, MD [address] [place], [place] [address] [phone]  [facility]     [address]     [place], [place] [address]     [phone]       [facility] [address] [place], [place] [address] [phone] Resources for Temporary Residential Assistance/Crisis Centers  [facility]) M-F 8am-3pm   [address] [place], [place] [address]   [phone] Services include: laundry, barbering, support groups, case management, phone  & computer access, showers, AA/NA mtgs, mental health/substance abuse nurse, job skills class, disability information, VA assistance, spiritual classes, etc.   HOMELESS SHELTERS  [facility]     [facility]   [address], [place]     [phone]              [facility] (women and children)       [address] [place], [place] [address] [phone] [email or web address] for application and process Application Required  [facility]   [address]    [place] [address]     [phone]  [place], [place] [address] [phone](APOLIDCV application appt.) Application Required  [facility] (women only)    [address]     [place], [place] [address]     [phone]      Intake starts 6pm daily Need valid ID, SSC, & Police report [employer] [address] [place], [place] [phone] Application Required  [employer] (men only)     [place]      [place], [place]       [facility] (Pregnant women only) [address] [place], [facility]  [facility]      [address]      [place], [place] [address]     [phone]             [facility] [address] [place], [place] 90 day commitment/SA/Application process  [facility](men only)     [address]     [place], [place]       Check-in at [facility] [address] [place], [place] [address] [phone] Men/Women/Women and Children must be there by 7 pm  [place], [place]

## 2018-06-20 NOTE — ED Triage Notes (Signed)
Pt reports she has been feeling weak and shaky for the last 4 hours. She feels like she is going to pass out. Reports that she feels the same way she did when she had blood transfusion last year due to heavy menstrual cycles but had an ablation in sept 2019

## 2018-06-20 NOTE — ED Provider Notes (Signed)
[facility] EMERGENCY DEPARTMENT Provider Note   CSN: [id number] Arrival date & time: 06/20/18  1649     History   Chief Complaint Chief Complaint  Patient presents with  . Weakness    HPI [name] is a 38 y.o. female.  HPI  Pt was seen at 1940. Per pt, c/o gradual onset and persistence of constant generalized weakness and "shakiness" for the past 2 days. Has been associated with generalized fatigue and constant lightheadedness. Pt states she "feels like when I needed a blood transfusion" (denies any areas of bleeding or bruising, however) and is concerned regarding her blood counts. Pt also endorses hx of significant anxiety and feels her symptoms may also be due to that. Denies CP/palpitations, no SOB/cough, no abd pain, no N/V/D, no back pain, no fevers, no rash, no focal motor weakness, no tingling/numbness in extremities. The symptoms have been associated with no other complaints. The patient has a significant history of similar symptoms previously, recently being evaluated for this complaint and multiple prior evals for same.     Past Medical History:  Diagnosis Date  . Diastolic dysfunction 09/28/2014   Grade 2. Mod LVH. EF 60%  . GERD (gastroesophageal reflux disease)   . Hypertension   . Hypertensive urgency 09/26/2014  . 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 MRI brain and C-spine unremarkable  . Microcytic anemia 09/27/2014   Heavy menstrual periods  . Preeclampsia     x3    Patient Active Problem List   Diagnosis Date Noted  . 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 Menorrhagia 11/14/2017  . Acute on chronic blood loss anemia 11/14/2017  . Hypokalemia 11/14/2017  . Hypertension 10/01/2014  . Anemia 10/01/2014  . Anxiety and depression 10/01/2014  . Diastolic dysfunction 09/29/2014  . Syncope 09/27/2014  . Left sided numbness 09/27/2014  . Chest pressure 09/27/2014  . Microcytic anemia 09/27/2014  . Hypertensive urgency 09/26/2014   . TIA (transient ischemic attack) 09/26/2014  . Tobacco abuse 09/26/2014  . 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 Preeclampsia     Past Surgical History:  Procedure Laterality Date  . c-sections    . CESAREAN SECTION     x 3  . DILITATION & CURRETTAGE/HYSTROSCOPY WITH NOVASURE ABLATION N/A 01/15/2018   Procedure: DILATATION & CURETTAGE/HYSTEROSCOPY WITH MINERVA ABLATION;  Surgeon: Lazaro ArmsEure, Luther H, MD;  Location: [facility];  Service: Gynecology;  Laterality: N/A;  . TUBAL LIGATION     2012     OB History    Gravida  3   Para  3   Term  2   Preterm  1   AB      Living  2     SAB      TAB      Ectopic      Multiple      Live Births  2            Home Medications    Prior to Admission medications   Medication Sig Start Date End Date Taking? Authorizing Provider  ALPRAZolam (XANAX) 0.25 MG tablet Take 1 tablet (0.25 mg total) by mouth 3 (three) times daily as needed for anxiety. 06/02/18   Loren RacerYelverton, David, MD  amLODipine (NORVASC) 10 MG tablet Take 1 tablet (10 mg total) by mouth daily. 06/02/18   Loren RacerYelverton, David, MD  folic acid (FOLVITE) 400 MCG tablet Take 400 mcg by mouth daily.    [provider]  HYDROcodone-acetaminophen (NORCO/VICODIN) 5-325 MG  tablet Take 1 tablet by mouth every 6 (six) hours as needed. 01/15/18   Lazaro Arms, MD  ketorolac (TORADOL) 10 MG tablet Take 1 tablet (10 mg total) by mouth every 8 (eight) hours as needed. 01/15/18   Lazaro Arms, MD  lisinopril-hydrochlorothiazide (PRINZIDE,ZESTORETIC) 20-25 MG tablet Take 1 tablet by mouth daily. 06/02/18   Loren Racer, MD  ondansetron (ZOFRAN ODT) 8 MG disintegrating tablet Take 1 tablet (8 mg total) by mouth every 8 (eight) hours as needed for nausea or vomiting. 01/15/18   Lazaro Arms, MD  potassium chloride 20 MEQ TBCR Take 20 mEq by mouth daily. Patient taking differently: Take 20 mEq by mouth 3 (three) times daily.  01/01/18   Lazaro Arms, MD    Family  History Family History  Problem Relation Age of Onset  . Hypertension Mother   . Diabetes Mother   . Anemia Mother   . Hypertension Father   . Diabetes Father   . Heart attack Paternal Grandfather   . Hypertension Paternal Grandmother   . Diabetes Paternal Grandmother   . Cancer Maternal Grandmother   . Alcohol abuse Maternal Grandfather   . Anemia Sister   . Hypertension Sister     Social History Social History   Tobacco Use  . Smoking status: Current Every Day Smoker    Packs/day: 0.25    Years: 10.00    Pack years: 2.50    Types: Cigarettes  . Smokeless tobacco: Never Used  Substance Use Topics  . Alcohol use: Not Currently  . Drug use: Never     Allergies   Patient has no known allergies.   Review of Systems Review of Systems ROS: Statement: All systems negative except as marked or noted in the HPI; Constitutional: Negative for fever and chills. +shakiness, lightheadedness, generalized fatigue.; ; Eyes: Negative for eye pain, redness and discharge. ; ; ENMT: Negative for ear pain, hoarseness, nasal congestion, sinus pressure and sore throat. ; ; Cardiovascular: Negative for chest pain, palpitations, diaphoresis, dyspnea and peripheral edema. ; ; Respiratory: Negative for cough, wheezing and stridor. ; ; Gastrointestinal: Negative for nausea, vomiting, diarrhea, abdominal pain, blood in stool, hematemesis, jaundice and rectal bleeding. . ; ; Genitourinary: Negative for dysuria, flank pain and hematuria. ; ; Musculoskeletal: Negative for back pain and neck pain. Negative for swelling and trauma.; ; Skin: Negative for pruritus, rash, abrasions, blisters, bruising and skin lesion.; ; Neuro: Negative for headache and neck stiffness. Negative for altered level of consciousness, altered mental status, extremity weakness, paresthesias, involuntary movement, seizure and syncope.; Psych:  +anxiety. No SI, no SA, no HI, no hallucinations.      Physical Exam Updated Vital  Signs BP (!) 185/117 (BP Location: Right Arm)   Pulse (!) 114   Temp 98.6 F (37 C) (Oral)   Resp 16   Ht 5\' 6"  (1.676 m)   Wt 99.8 kg   SpO2 100%   BMI 35.51 kg/m   20:01 Orthostatic Vital Signs TE  Orthostatic Lying   BP- Lying: 177/116Abnormal   Pulse- Lying: 87      Orthostatic Sitting  BP- Sitting: 179/112Abnormal   Pulse- Sitting: 85      Orthostatic Standing at 0 minutes  BP- Standing at 0 minutes: 164/115Abnormal   Pulse- Standing at 0 minutes: 106    Physical Exam 1945: Physical examination:  Nursing notes reviewed; Vital signs and O2 SAT reviewed;  Constitutional: Well developed, Well nourished, Well hydrated, In no acute distress; Head:  Normocephalic, atraumatic; Eyes: EOMI, PERRL, No scleral icterus; ENMT: Mouth and pharynx normal, Mucous membranes moist; Neck: Supple, Full range of motion, No lymphadenopathy; Cardiovascular: Regular rate and rhythm, No gallop; Respiratory: Breath sounds clear & equal bilaterally, No wheezes.  Speaking full sentences with ease, Normal respiratory effort/excursion; Chest: Nontender, Movement normal; Abdomen: Soft, Nontender, Nondistended, Normal bowel sounds; Genitourinary: No CVA tenderness; Extremities: Peripheral pulses normal, No tenderness, No edema, No calf edema or asymmetry.; Neuro: AA&Ox3, Major CN grossly intact.  Speech clear. No gross focal motor or sensory deficits in extremities. Climbs on and off stretcher easily by herself. Gait steady..; Skin: Color normal, Warm, Dry.; Psych:  Anxious, tearful and crying throughout HPI.    ED Treatments / Results  Labs (all labs ordered are listed, but only abnormal results are displayed)   EKG EKG Interpretation  Date/Time:  Friday June 20 2018 20:10:42 EST Ventricular Rate:  84 PR Interval:    QRS Duration: 82 QT Interval:  379 QTC Calculation: 448 R Axis:   65 Text Interpretation:  Sinus rhythm Probable left atrial enlargement Probable left ventricular hypertrophy  Nonspecific T abnormalities, diffuse leads When compared with ECG of 06/03/2018 No significant change was found Confirmed by Samuel Jester [id number]) on 06/20/2018 8:15:57 PM   Radiology   Procedures Procedures (including critical care time)  Medications Ordered in ED Medications  potassium chloride SA (K-DUR,KLOR-CON) CR tablet 40 mEq (40 mEq Oral Given 06/20/18 1956)  LORazepam (ATIVAN) tablet 1 mg (1 mg Oral Given 06/20/18 1957)     Initial Impression / Assessment and Plan / ED Course  I have reviewed the triage vital signs and the nursing notes.  Pertinent labs & imaging results that were available during my care of the patient were reviewed by me and considered in my medical decision making (see chart for details).  MDM Reviewed: previous chart, nursing note and vitals Reviewed previous: labs and ECG Interpretation: labs and ECG   Results for orders placed or performed during the hospital encounter of 06/20/18  Basic metabolic panel  Result Value Ref Range   Sodium 137 135 - 145 mmol/L   Potassium 3.1 (L) 3.5 - 5.1 mmol/L   Chloride 106 98 - 111 mmol/L   CO2 22 22 - 32 mmol/L   Glucose, Bld 134 (H) 70 - 99 mg/dL   BUN 10 6 - 20 mg/dL   Creatinine, Ser 5.97 0.44 - 1.00 mg/dL   Calcium 9.3 8.9 - 41.6 mg/dL   GFR calc non Af Amer >60 >60 mL/min   GFR calc Af Amer >60 >60 mL/min   Anion gap 9 5 - 15  CBC  Result Value Ref Range   WBC 6.0 4.0 - 10.5 K/uL   RBC 4.45 3.87 - 5.11 MIL/uL   Hemoglobin 12.7 12.0 - 15.0 g/dL   HCT 38.4 53.6 - 46.8 %   MCV 86.1 80.0 - 100.0 fL   MCH 28.5 26.0 - 34.0 pg   MCHC 33.2 30.0 - 36.0 g/dL   RDW 03.2 12.2 - 48.2 %   Platelets 262 150 - 400 K/uL   nRBC 0.0 0.0 - 0.2 %  Urinalysis, Routine w reflex microscopic  Result Value Ref Range   Color, Urine YELLOW YELLOW   APPearance CLEAR CLEAR   Specific Gravity, Urine 1.019 1.005 - 1.030   pH 5.0 5.0 - 8.0   Glucose, UA NEGATIVE NEGATIVE mg/dL   Hgb urine dipstick MODERATE (A) NEGATIVE    Bilirubin Urine NEGATIVE NEGATIVE   Ketones,  ur NEGATIVE NEGATIVE mg/dL   Protein, ur NEGATIVE NEGATIVE mg/dL   Nitrite NEGATIVE NEGATIVE   Leukocytes, UA NEGATIVE NEGATIVE   RBC / HPF 0-5 0 - 5 RBC/hpf   WBC, UA 0-5 0 - 5 WBC/hpf   Bacteria, UA NONE SEEN NONE SEEN   Squamous Epithelial / LPF 0-5 0 - 5   Mucus PRESENT    Hyaline Casts, UA PRESENT   Magnesium  Result Value Ref Range   Magnesium 1.9 1.7 - 2.4 mg/dL  Pregnancy, urine  Result Value Ref Range   Preg Test, Ur NEGATIVE NEGATIVE    2200:  Pt seen multiple times in ED this month for same complaints. Pt not orthostatic on VS. CT head 2 weeks ago negative for acute process and pt's neuro exam is intact today; will not repeat. Pt states she feels better after ativan PO and is requesting a rx for same. Explained PMD wound need to rx benzo, not ED, but that I would rx a medication for her anxiety. Pt verb understanding. States she is ready to go home now. Dx and testing d/w pt.  Questions answered.  Verb understanding, agreeable to d/c home with outpt f/u.    Final Clinical Impressions(s) / ED Diagnoses   Final diagnoses:  None    ED Discharge Orders    None       Samuel JesterMcManus, Keriann Rankin, DO 06/23/18 1344

## 2018-11-06 ENCOUNTER — Emergency Department (HOSPITAL_COMMUNITY)
Admission: EM | Admit: 2018-11-06 | Discharge: 2018-11-07 | Disposition: A | Discharge status: Home / Self Care | Payer: Self-pay | Attending: Emergency Medicine | Admitting: Emergency Medicine

## 2018-11-06 ENCOUNTER — Other Ambulatory Visit: Payer: Self-pay

## 2018-11-06 ENCOUNTER — Emergency Department (HOSPITAL_COMMUNITY): Payer: Self-pay

## 2018-11-06 ENCOUNTER — Encounter (HOSPITAL_COMMUNITY): Payer: Self-pay | Admitting: Emergency Medicine

## 2018-11-06 DIAGNOSIS — I160 Hypertensive urgency: Secondary | ICD-10-CM

## 2018-11-06 DIAGNOSIS — Z79899 Other long term (current) drug therapy: Secondary | ICD-10-CM | POA: Insufficient documentation

## 2018-11-06 DIAGNOSIS — E041 Nontoxic single thyroid nodule: Secondary | ICD-10-CM

## 2018-11-06 DIAGNOSIS — F17210 Nicotine dependence, cigarettes, uncomplicated: Secondary | ICD-10-CM | POA: Insufficient documentation

## 2018-11-06 HISTORY — DX: Anemia, unspecified: D64.9

## 2018-11-06 LAB — BASIC METABOLIC PANEL
Anion gap: 11 (ref 5–15)
BUN: 9 mg/dL (ref 6–20)
CO2: 21 mmol/L — ABNORMAL LOW (ref 22–32)
Calcium: 9.6 mg/dL (ref 8.9–10.3)
Chloride: 106 mmol/L (ref 98–111)
Creatinine, Ser: 0.84 mg/dL (ref 0.44–1.00)
GFR calc Af Amer: >60 mL/min (ref >60)
GFR calc non Af Amer: >60 mL/min (ref >60)
Glucose, Bld: 116 mg/dL — ABNORMAL HIGH (ref 70–99)
Potassium: 3.3 mmol/L — ABNORMAL LOW (ref 3.5–5.1)
Sodium: 138 mmol/L (ref 135–145)

## 2018-11-06 LAB — TROPONIN I: Troponin I: <0.03 ng/mL (ref <0.03)

## 2018-11-06 LAB — URINALYSIS, ROUTINE W REFLEX MICROSCOPIC
Bilirubin Urine: NEGATIVE
Glucose, UA: NEGATIVE mg/dL
Ketones, ur: NEGATIVE mg/dL
Leukocytes,Ua: NEGATIVE
Nitrite: NEGATIVE
Protein, ur: NEGATIVE mg/dL
Specific Gravity, Urine: 1.013 (ref 1.005–1.030)
pH: 6 (ref 5.0–8.0)

## 2018-11-06 LAB — CBC
HCT: 41.6 % (ref 36.0–46.0)
Hemoglobin: 13.1 g/dL (ref 12.0–15.0)
MCH: 27.3 pg (ref 26.0–34.0)
MCHC: 31.5 g/dL (ref 30.0–36.0)
MCV: 86.8 fL (ref 80.0–100.0)
Platelets: 261 10*3/uL (ref 150–400)
RBC: 4.79 MIL/uL (ref 3.87–5.11)
RDW: 14.2 % (ref 11.5–15.5)
WBC: 8.3 10*3/uL (ref 4.0–10.5)
nRBC: 0 % (ref 0.0–0.2)

## 2018-11-06 LAB — D-DIMER, QUANTITATIVE: D-Dimer, Quant: 0.99 ug/mL-FEU — ABNORMAL HIGH (ref 0.00–0.50)

## 2018-11-06 LAB — POCT PREGNANCY, URINE: Preg Test, Ur: NEGATIVE

## 2018-11-06 MED ORDER — LORAZEPAM 1 MG PO TABS
1.0000 mg | ORAL_TABLET | Freq: Once | ORAL | Status: AC
  Administered 2018-11-06: 1 mg via ORAL
  Filled 2018-11-06: qty 1

## 2018-11-06 MED ORDER — HYDRALAZINE HCL 20 MG/ML IJ SOLN
10.0000 mg | Freq: Once | INTRAMUSCULAR | Status: AC
  Administered 2018-11-06: 10 mg via INTRAVENOUS
  Filled 2018-11-06: qty 1

## 2018-11-06 MED ORDER — HYDRALAZINE HCL 20 MG/ML IJ SOLN
5.0000 mg | Freq: Once | INTRAMUSCULAR | Status: AC
  Administered 2018-11-06: 5 mg via INTRAVENOUS
  Filled 2018-11-06: qty 1

## 2018-11-06 MED ORDER — ACETAMINOPHEN 325 MG PO TABS
650.0000 mg | ORAL_TABLET | Freq: Once | ORAL | Status: AC
  Administered 2018-11-06: 650 mg via ORAL
  Filled 2018-11-06: qty 2

## 2018-11-06 MED ORDER — SODIUM CHLORIDE 0.9 % IV BOLUS
1000.0000 mL | Freq: Once | INTRAVENOUS | Status: AC
  Administered 2018-11-06: 1000 mL via INTRAVENOUS

## 2018-11-06 MED ORDER — IOHEXOL 350 MG/ML SOLN
100.0000 mL | Freq: Once | INTRAVENOUS | Status: AC | PRN
  Administered 2018-11-06: 100 mL via INTRAVENOUS

## 2018-11-06 NOTE — ED Notes (Signed)
ED Provider at bedside. 

## 2018-11-06 NOTE — ED Provider Notes (Signed)
[facility] EMERGENCY DEPARTMENT Provider Note   CSN: [id number] Arrival date & time: 11/06/18  1819     History   Chief Complaint Chief Complaint  Patient presents with  . Hypertension    HPI ZARETH RIPPETOE is a 38 y.o. female.     HPI   SVARA TWYMAN is a 38 y.o. female with a hx of anemia, HTN,(poorly controlled) who presents to the Emergency Department complaining of generalized fatigue, frontal headache, blurred vision, upper chest pain and tingling of her left arm.  She has been out of her blood pressure medications for 4-5 months.  This is a recurrent problem for her as she is currently unemployed.  She admits that she and her mother having been sharing her mother's medications.  She last took one dose of lisinopril and one Norvasc 5 days ago.  Today, she felt her "heart racing" and tingling down her left arm and side and was concerned that she was going to "pass out."  Symptoms were associated with a frontal, throbbing headache.  States that she has been unable to establish a PCP and does not currently have insurance.  She has also been without her anti-anxiety medication.  She denies fever, cough, shortness of breath, facial weakness, vomiting or extremity weakness.     Past Medical History:  Diagnosis Date  . Anemia   . Diastolic dysfunction 02/02/9891   Grade 2. Mod LVH. EF 60%  . GERD (gastroesophageal reflux disease)   . Hypertension   . Hypertensive urgency 09/26/2014  . 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 MRI brain and C-spine unremarkable  . Microcytic anemia 09/27/2014   Heavy menstrual periods  . Preeclampsia     x3    Patient Active Problem List   Diagnosis Date Noted  . 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 Menorrhagia 11/14/2017  . Acute on chronic blood loss anemia 11/14/2017  . Hypokalemia 11/14/2017  . Hypertension 10/01/2014  . Anemia 10/01/2014  . Anxiety and depression 10/01/2014  . Diastolic dysfunction 11/94/1740   . Syncope 09/27/2014  . Left sided numbness 09/27/2014  . Chest pressure 09/27/2014  . Microcytic anemia 09/27/2014  . Hypertensive urgency 09/26/2014  . TIA (transient ischemic attack) 09/26/2014  . Tobacco abuse 09/26/2014  . 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 Preeclampsia     Past Surgical History:  Procedure Laterality Date  . c-sections    . CESAREAN SECTION     x 3  . DILITATION & CURRETTAGE/HYSTROSCOPY WITH NOVASURE ABLATION N/A 01/15/2018   Procedure: DILATATION & CURETTAGE/HYSTEROSCOPY WITH MINERVA ABLATION;  Surgeon: Florian Buff, MD;  Location: [facility];  Service: Gynecology;  Laterality: N/A;  . TUBAL LIGATION     2012     OB History    Gravida  3   Para  3   Term  2   Preterm  1   AB      Living  2     SAB      TAB      Ectopic      Multiple      Live Births  2            Home Medications    Prior to Admission medications   Medication Sig Start Date End Date Taking? Authorizing Provider  ALPRAZolam (XANAX) 0.25 MG tablet Take 1 tablet (0.25 mg total) by mouth 3 (three) times daily as needed for anxiety. Patient not taking: Reported on 06/20/2018 06/02/18  Loren RacerYelverton, David, MD  amLODipine (NORVASC) 10 MG tablet Take 1 tablet (10 mg total) by mouth daily. 06/02/18   Loren RacerYelverton, David, MD  ferrous sulfate 325 (65 FE) MG tablet Take 325 mg by mouth daily with breakfast.    [provider]  folic acid (FOLVITE) 400 MCG tablet Take 400 mcg by mouth daily.    [provider]  hydrOXYzine (ATARAX/VISTARIL) 25 MG tablet Take 1 tablet (25 mg total) by mouth every 8 (eight) hours as needed for anxiety. 06/20/18   Samuel JesterMcManus, Kathleen, DO  lisinopril-hydrochlorothiazide (PRINZIDE,ZESTORETIC) 20-25 MG tablet Take 1 tablet by mouth daily. 06/02/18   Loren RacerYelverton, David, MD    Family History Family History  Problem Relation Age of Onset  . Hypertension Mother   . Diabetes Mother   . Anemia Mother   . Hypertension Father   . Diabetes  Father   . Heart attack Paternal Grandfather   . Hypertension Paternal Grandmother   . Diabetes Paternal Grandmother   . Cancer Maternal Grandmother   . Alcohol abuse Maternal Grandfather   . Anemia Sister   . Hypertension Sister     Social History Social History   Tobacco Use  . Smoking status: Current Every Day Smoker    Packs/day: 0.25    Years: 10.00    Pack years: 2.50    Types: Cigarettes  . Smokeless tobacco: Never Used  Substance Use Topics  . Alcohol use: Not Currently  . Drug use: Never     Allergies   Patient has no known allergies.   Review of Systems Review of Systems  Constitutional: Positive for fatigue. Negative for activity change, appetite change and fever.  HENT: Negative for facial swelling and trouble swallowing.   Eyes: Positive for visual disturbance. Negative for pain.  Respiratory: Negative for chest tightness and shortness of breath.   Cardiovascular: Positive for chest pain.  Gastrointestinal: Negative for abdominal pain, nausea and vomiting.  Genitourinary: Negative for decreased urine volume and dysuria.  Musculoskeletal: Negative for neck pain and neck stiffness.  Skin: Negative for rash and wound.  Neurological: Positive for headaches. Negative for dizziness, seizures, syncope, facial asymmetry, speech difficulty, weakness and numbness.       Tingling of her left chest and left arm.    Psychiatric/Behavioral: Negative for confusion and decreased concentration.     Physical Exam Updated Vital Signs BP (!) 169/92   Pulse 84   Temp 98.8 F (37.1 C) (Tympanic)   Resp (!) 23   SpO2 100%   Physical Exam Vitals signs and nursing note reviewed.  Constitutional:      General: She is not in acute distress.    Appearance: Normal appearance. She is not toxic-appearing.     Comments: Pt is anxious appearing and tearful  HENT:     Head: Atraumatic.     Mouth/Throat:     Mouth: Mucous membranes are moist.     Pharynx: Oropharynx is  clear.  Eyes:     Extraocular Movements: Extraocular movements intact.     Pupils: Pupils are equal, round, and reactive to light.  Neck:     Musculoskeletal: Normal range of motion.  Cardiovascular:     Rate and Rhythm: Regular rhythm. Tachycardia present.     Pulses: Normal pulses.  Pulmonary:     Effort: Pulmonary effort is normal.     Breath sounds: No wheezing.  Chest:     Chest wall: No tenderness.  Abdominal:     Palpations: Abdomen is soft.  Tenderness: There is no abdominal tenderness.  Musculoskeletal: Normal range of motion.     Right lower leg: No edema.     Left lower leg: No edema.  Lymphadenopathy:     Cervical: No cervical adenopathy.  Skin:    General: Skin is warm.     Capillary Refill: Capillary refill takes less than 2 seconds.     Findings: No rash.  Neurological:     General: No focal deficit present.     Mental Status: She is alert.     Sensory: No sensory deficit.     Motor: No weakness.      ED Treatments / Results  Labs (all labs ordered are listed, but only abnormal results are displayed) Labs Reviewed  BASIC METABOLIC PANEL - Abnormal; Notable for the following components:      Result Value   Potassium 3.3 (*)    CO2 21 (*)    Glucose, Bld 116 (*)    All other components within normal limits  URINALYSIS, ROUTINE W REFLEX MICROSCOPIC - Abnormal; Notable for the following components:   APPearance HAZY (*)    Hgb urine dipstick MODERATE (*)    Bacteria, UA RARE (*)    All other components within normal limits  D-DIMER, QUANTITATIVE (NOT AT [facility]) - Abnormal; Notable for the following components:   D-Dimer, Quant 0.99 (*)    All other components within normal limits  URINE CULTURE  CBC  TROPONIN I  POCT PREGNANCY, URINE    ED ECG REPORT   reviewed by Dr. Estell HarpinZammit           Radiology Dg Chest 2 View  Result Date: 11/06/2018 CLINICAL DATA:  Chest pain. EXAM: CHEST - 2 VIEW COMPARISON:  None. FINDINGS: The heart size and  mediastinal contours are within normal limits. Both lungs are clear. No pneumothorax or pleural effusion is noted. The visualized skeletal structures are unremarkable. IMPRESSION: No active cardiopulmonary disease. Electronically Signed   By: Lupita RaiderJames  Green Jr M.D.   On: 11/06/2018 18:50   Ct Angio Chest Pe W And/or Wo Contrast  Result Date: 11/06/2018 CLINICAL DATA:  Hypertension with chest pain and blurred vision. EXAM: CT ANGIOGRAPHY CHEST WITH CONTRAST TECHNIQUE: Multidetector CT imaging of the chest was performed using the standard protocol during bolus administration of intravenous contrast. Multiplanar CT image reconstructions and MIPs were obtained to evaluate the vascular anatomy. CONTRAST:  100mL OMNIPAQUE IOHEXOL 350 MG/ML SOLN COMPARISON:  None. FINDINGS: Cardiovascular: Evaluation for pulmonary emboli is limited by suboptimal contrast bolus timing and significant motion artifact. Given this limitation there is no definite PE identified on this exam. Detection of segmental and subsegmental pulmonary emboli is severely limited. Mediastinum/Nodes: There is a right-sided thyroid nodule measuring approximately 1.2 cm. There are enlarged axillary lymph nodes, right worse than left. There is fat stranding in the partially visualized right axilla of unknown clinical significance. There are no pathologically enlarged supraclavicular lymph nodes. Lungs/Pleura: Lungs are clear. No pleural effusion or pneumothorax. Upper Abdomen: There is a patent steatosis. The patient is status post prior cholecystectomy. The adrenal glands are unremarkable. Musculoskeletal: No chest wall abnormality. No acute or significant osseous findings. Review of the MIP images confirms the above findings. IMPRESSION: 1. Very limited study secondary to poor contrast bolus timing and motion artifact. Given this limitation, there was no definite PE identified. 2. Axillary adenopathy on the right with adjacent fat stranding is suspicious for  a soft tissue infection of the right axilla. Correlation with physical exam  is recommended. 3. 1.2 cm right-sided thyroid nodule. Follow-up with outpatient thyroid ultrasound is recommended if this has not already been performed. 4. Hepatic steatosis. Electronically Signed   By: Katherine Mantlehristopher  Green M.D.   On: 11/06/2018 23:32    Procedures Procedures (including critical care time)  Medications Ordered in ED Medications  sodium chloride 0.9 % bolus 1,000 mL (1,000 mLs Intravenous New Bag/Given 11/06/18 1933)  hydrALAZINE (APRESOLINE) injection 10 mg (10 mg Intravenous Given 11/06/18 1933)  acetaminophen (TYLENOL) tablet 650 mg (650 mg Oral Given 11/06/18 1957)     Initial Impression / Assessment and Plan / ED Course  I have reviewed the triage vital signs and the nursing notes.  Pertinent labs & imaging results that were available during my care of the patient were reviewed by me and considered in my medical decision making (see chart for details).        2030  Pt resting comfortably.  Reports feeling better after the hydralazine.  BP now 170's.  Chest pain has resolved.       2340  Pt resting, no longer anxious appearing.  She remains hypertensive, but no longer symptomatic.  No emergent need for further reduction at this time.  She states she is ready for d/c home. I have repeatedly stressed the importance of taking her medications daily and to establish a PCP.   I will refill her medications and she agrees to close f/u with health dept in 1-2 days.  I will also provide referral info for local clinic.  strict return precautions also discussed along with presence of a small thyroid nodule seen on CT.  It is felt that this can be further evaluated as out pt.  she verbalized understanding and agrees to plan.     Final Clinical Impressions(s) / ED Diagnoses   Final diagnoses:  Hypertensive urgency  Thyroid nodule    ED Discharge Orders    None       Rosey Bathriplett, Tenisha Fleece, PA-C 11/07/18  0027    Bethann BerkshireZammit, Joseph, MD 11/10/18 [phone]

## 2018-11-06 NOTE — ED Triage Notes (Addendum)
Pt c/o of hypertension, blurred vision, chest discomfort, and headache x 3 days.  Pt has no had any blood pressure medication in over a year due to no PCP or insurance.

## 2018-11-06 NOTE — ED Notes (Signed)
Pt states she does not need to urinate at this time, aware of DO, call light w/in reach 

## 2018-11-07 MED ORDER — AMLODIPINE BESYLATE 10 MG PO TABS: 10.0000 mg | ORAL_TABLET | Freq: Every day | ORAL | 1 refills | Status: DC

## 2018-11-07 MED ORDER — ALPRAZOLAM 0.25 MG PO TABS: 0.2500 mg | ORAL_TABLET | Freq: Three times a day (TID) | ORAL | 0 refills | Status: DC | PRN

## 2018-11-07 MED ORDER — POTASSIUM CHLORIDE CRYS ER 20 MEQ PO TBCR: 20.0000 meq | EXTENDED_RELEASE_TABLET | Freq: Two times a day (BID) | ORAL | 0 refills | Status: DC

## 2018-11-07 MED ORDER — LISINOPRIL-HYDROCHLOROTHIAZIDE 20-25 MG PO TABS: 1.0000 | ORAL_TABLET | Freq: Every day | ORAL | 1 refills | Status: DC

## 2018-11-07 NOTE — Discharge Instructions (Addendum)
I have listed several resources for you to contact to establish primary care.  It's very important that you take your blood pressure medications every day as directed.  As discussed, a small nodule on your thyroid was seen on your CT scan and will need further evaluation.  This can also be done by a primary doctor.

## 2018-11-09 LAB — URINE CULTURE: Culture: 10000 — AB

## 2018-11-10 ENCOUNTER — Telehealth: Payer: Self-pay | Admitting: Emergency Medicine

## 2018-11-10 NOTE — Telephone Encounter (Signed)
Post ED Visit - Positive Culture Follow-up  Culture report reviewed by antimicrobial stewardship pharmacist: [place] Team []  Elenor Quinones, Pharm.D. []  Heide Guile, Pharm.D., BCPS AQ-ID []  Parks Neptune, Pharm.D., BCPS []  Alycia Rossetti, Pharm.D., BCPS []  Carmine, Pharm.D., BCPS, AAHIVP []  Legrand Como, Pharm.D., BCPS, AAHIVP []  Salome Arnt, PharmD, BCPS []  Johnnette Gourd, PharmD, BCPS []  Hughes Better, PharmD, BCPS []  Leeroy Cha, PharmD []  Laqueta Linden, PharmD, BCPS []  Albertina Parr, PharmD  [place] Team []  Leodis Sias, PharmD []  Lindell Spar, PharmD []  Royetta Asal, PharmD []  Graylin Shiver, Rph []  Rema Fendt) Glennon Mac, PharmD []  Arlyn Dunning, PharmD []  Netta Cedars, PharmD []  Dia Sitter, PharmD []  Leone Haven, PharmD []  Gretta Arab, PharmD [x]  Theodis Shove, PharmD []  Peggyann Juba, PharmD []  Reuel Boom, PharmD   Positive urine culture Treated with none, asymptomatic,organism sensitive to the same and no further patient follow-up is required at this time.  Hazle Nordmann 11/10/2018, 2:59 PM

## 2019-03-10 ENCOUNTER — Encounter (HOSPITAL_COMMUNITY): Payer: Self-pay

## 2019-03-10 ENCOUNTER — Other Ambulatory Visit: Payer: Self-pay

## 2019-03-10 DIAGNOSIS — R079 Chest pain, unspecified: Secondary | ICD-10-CM | POA: Insufficient documentation

## 2019-03-10 DIAGNOSIS — Z5321 Procedure and treatment not carried out due to patient leaving prior to being seen by health care provider: Secondary | ICD-10-CM | POA: Insufficient documentation

## 2019-03-10 NOTE — ED Triage Notes (Signed)
Pt presents to ED with complaints of generalized chest pain started 2 hours ago. Pt denies SOB, nausea, vomiting. Pt states she was at work and states pain feels like burning in her chest.

## 2019-03-11 ENCOUNTER — Emergency Department (HOSPITAL_COMMUNITY): Payer: Self-pay

## 2019-03-11 ENCOUNTER — Emergency Department (HOSPITAL_COMMUNITY)
Admission: EM | Admit: 2019-03-11 | Discharge: 2019-03-11 | Disposition: A | Discharge status: Home / Self Care | Payer: Self-pay | Attending: Emergency Medicine | Admitting: Emergency Medicine

## 2019-03-11 ENCOUNTER — Other Ambulatory Visit: Payer: Self-pay

## 2019-03-11 ENCOUNTER — Encounter (HOSPITAL_COMMUNITY): Payer: Self-pay | Admitting: *Deleted

## 2019-03-11 ENCOUNTER — Emergency Department (HOSPITAL_COMMUNITY)
Admission: EM | Admit: 2019-03-11 | Discharge: 2019-03-12 | Disposition: A | Discharge status: Home / Self Care | Payer: Self-pay | Attending: Emergency Medicine | Admitting: Emergency Medicine

## 2019-03-11 DIAGNOSIS — Z79899 Other long term (current) drug therapy: Secondary | ICD-10-CM | POA: Insufficient documentation

## 2019-03-11 DIAGNOSIS — I5032 Chronic diastolic (congestive) heart failure: Secondary | ICD-10-CM | POA: Insufficient documentation

## 2019-03-11 DIAGNOSIS — F17210 Nicotine dependence, cigarettes, uncomplicated: Secondary | ICD-10-CM | POA: Insufficient documentation

## 2019-03-11 DIAGNOSIS — I110 Hypertensive heart disease with heart failure: Secondary | ICD-10-CM | POA: Insufficient documentation

## 2019-03-11 DIAGNOSIS — R0789 Other chest pain: Secondary | ICD-10-CM | POA: Insufficient documentation

## 2019-03-11 MED ORDER — SODIUM CHLORIDE 0.9% FLUSH: 3.0000 mL | Freq: Once | INTRAVENOUS | Status: DC

## 2019-03-11 NOTE — ED Triage Notes (Signed)
Pt c/o chest pain that started last night while working; pt states she felt lightheaded when the pain started; pt denies any n/v or sob

## 2019-03-12 MED ORDER — ONDANSETRON HCL 4 MG PO TABS
4.0000 mg | ORAL_TABLET | Freq: Once | ORAL | Status: AC
  Administered 2019-03-12: 01:00:00 4 mg via ORAL
  Filled 2019-03-12: qty 1

## 2019-03-12 MED ORDER — CYCLOBENZAPRINE HCL 10 MG PO TABS
10.0000 mg | ORAL_TABLET | Freq: Once | ORAL | Status: AC
  Administered 2019-03-12: 10 mg via ORAL
  Filled 2019-03-12: qty 1

## 2019-03-12 MED ORDER — CYCLOBENZAPRINE HCL 10 MG PO TABS: 10.0000 mg | ORAL_TABLET | Freq: Three times a day (TID) | ORAL | 0 refills | Status: DC

## 2019-03-12 MED ORDER — DICLOFENAC SODIUM 75 MG PO TBEC: 75.0000 mg | DELAYED_RELEASE_TABLET | Freq: Two times a day (BID) | ORAL | 0 refills | Status: DC

## 2019-03-12 MED ORDER — KETOROLAC TROMETHAMINE 10 MG PO TABS
10.0000 mg | ORAL_TABLET | Freq: Once | ORAL | Status: AC
  Administered 2019-03-12: 01:00:00 10 mg via ORAL
  Filled 2019-03-12: qty 1

## 2019-03-12 NOTE — Discharge Instructions (Signed)
Please use warm tub soaks to chest daily. Use diclofenac 2 times daily. Use flexeril three times daily for spasm pain. This medication may cause drowsiness. Please do not drink, drive, or participate in activity that requires concentration while taking this medication. Rest your chest and arms as much as possible. Return if any changes in your condition, problems or concerns.

## 2019-03-12 NOTE — ED Provider Notes (Signed)
[facility] EMERGENCY DEPARTMENT Provider Note   CSN: [id number] Arrival date & time: 03/11/19  2146     History   Chief Complaint Chief Complaint  Patient presents with  . Chest Pain    HPI [name] is a 38 y.o. female.     The history is provided by the patient.  Chest Pain Pain location:  L chest and R chest Pain quality: aching, sharp and tightness   Pain radiates to:  Does not radiate Pain severity:  Moderate Onset quality:  Gradual Duration:  1 day Timing:  Intermittent Progression:  Worsening Chronicity:  New Context: lifting, movement and raising an arm   Relieved by: lying still. Worsened by:  Movement Associated symptoms: cough   Associated symptoms: no abdominal pain, no back pain, no dizziness, no fever, no nausea, no numbness, no orthopnea, no palpitations, no PND, no shortness of breath, no syncope, no vomiting and no weakness   Risk factors: smoking     Past Medical History:  Diagnosis Date  . Anemia   . Diastolic dysfunction 09/28/2014   Grade 2. Mod LVH. EF 60%  . GERD (gastroesophageal reflux disease)   . Hypertension   . Hypertensive urgency 09/26/2014  . 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 MRI brain and C-spine unremarkable  . Microcytic anemia 09/27/2014   Heavy menstrual periods  . Preeclampsia     x3    Patient Active Problem List   Diagnosis Date Noted  . 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 Menorrhagia 11/14/2017  . Acute on chronic blood loss anemia 11/14/2017  . Hypokalemia 11/14/2017  . Hypertension 10/01/2014  . Anemia 10/01/2014  . Anxiety and depression 10/01/2014  . Diastolic dysfunction 09/29/2014  . Syncope 09/27/2014  . Left sided numbness 09/27/2014  . Chest pressure 09/27/2014  . Microcytic anemia 09/27/2014  . Hypertensive urgency 09/26/2014  . TIA (transient ischemic attack) 09/26/2014  . Tobacco abuse 09/26/2014  . 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 Preeclampsia     Past Surgical History:  Procedure Laterality Date  . c-sections    . CESAREAN SECTION     x 3  . DILITATION & CURRETTAGE/HYSTROSCOPY WITH NOVASURE ABLATION N/A 01/15/2018   Procedure: DILATATION & CURETTAGE/HYSTEROSCOPY WITH MINERVA ABLATION;  Surgeon: Lazaro Arms, MD;  Location: [facility];  Service: Gynecology;  Laterality: N/A;  . TUBAL LIGATION     2012     OB History    Gravida  3   Para  3   Term  2   Preterm  1   AB      Living  2     SAB      TAB      Ectopic      Multiple      Live Births  2            Home Medications    Prior to Admission medications   Medication Sig Start Date End Date Taking? Authorizing Provider  ALPRAZolam (XANAX) 0.25 MG tablet Take 1 tablet (0.25 mg total) by mouth 3 (three) times daily as needed for anxiety. 11/07/18   Triplett, Tammy, PA-C  amLODipine (NORVASC) 10 MG tablet Take 1 tablet (10 mg total) by mouth daily. 11/07/18   Triplett, Tammy, PA-C  ferrous sulfate 325 (65 FE) MG tablet Take 325 mg by mouth daily with breakfast.    [provider]  folic acid (FOLVITE) 400 MCG tablet Take 400 mcg by mouth daily.  [provider]  hydrOXYzine (ATARAX/VISTARIL) 25 MG tablet Take 1 tablet (25 mg total) by mouth every 8 (eight) hours as needed for anxiety. 06/20/18   Francine Graven, DO  lisinopril-hydrochlorothiazide (ZESTORETIC) 20-25 MG tablet Take 1 tablet by mouth daily. 11/07/18   Triplett, Tammy, PA-C  potassium chloride SA (K-DUR) 20 MEQ tablet Take 1 tablet (20 mEq total) by mouth 2 (two) times daily. 11/07/18   Kem Parkinson, PA-C    Family History Family History  Problem Relation Age of Onset  . Hypertension Mother   . Diabetes Mother   . Anemia Mother   . Hypertension Father   . Diabetes Father   . Heart attack Paternal Grandfather   . Hypertension Paternal Grandmother   . Diabetes Paternal Grandmother   . Cancer Maternal Grandmother   . Alcohol abuse Maternal Grandfather   . Anemia  Sister   . Hypertension Sister     Social History Social History   Tobacco Use  . Smoking status: Current Every Day Smoker    Packs/day: 0.25    Years: 10.00    Pack years: 2.50    Types: Cigarettes  . Smokeless tobacco: Never Used  Substance Use Topics  . Alcohol use: Not Currently  . Drug use: Never     Allergies   Patient has no known allergies.   Review of Systems Review of Systems  Constitutional: Negative for activity change, appetite change and fever.  HENT: Negative for congestion, ear discharge, ear pain, facial swelling, nosebleeds, rhinorrhea, sneezing and tinnitus.   Eyes: Negative for photophobia, pain and discharge.  Respiratory: Positive for cough and chest tightness. Negative for choking, shortness of breath and wheezing.   Cardiovascular: Negative for palpitations, orthopnea, leg swelling, syncope and PND.  Gastrointestinal: Negative for abdominal pain, blood in stool, constipation, diarrhea, nausea and vomiting.  Genitourinary: Negative for difficulty urinating, dysuria, flank pain, frequency and hematuria.  Musculoskeletal: Negative for back pain, gait problem, myalgias and neck pain.  Skin: Negative for color change, rash and wound.  Neurological: Negative for dizziness, seizures, syncope, facial asymmetry, speech difficulty, weakness and numbness.  Hematological: Negative for adenopathy. Does not bruise/bleed easily.  Psychiatric/Behavioral: Negative for agitation, confusion, hallucinations, self-injury and suicidal ideas. The patient is not nervous/anxious.      Physical Exam Updated Vital Signs BP (!) 168/114 (BP Location: Right Wrist)   Pulse 78   Temp 98.4 F (36.9 C) (Oral)   Resp 18   Ht 5\' 6"  (1.676 m)   Wt 99.8 kg   SpO2 100%   BMI 35.51 kg/m   Physical Exam Vitals signs and nursing note reviewed.  Constitutional:      Appearance: She is well-developed. She is not toxic-appearing.  HENT:     Head: Normocephalic.     Right Ear:  Tympanic membrane and external ear normal.     Left Ear: Tympanic membrane and external ear normal.  Eyes:     General: Lids are normal.     Pupils: Pupils are equal, round, and reactive to light.  Neck:     Musculoskeletal: Normal range of motion and neck supple.     Vascular: No carotid bruit.  Cardiovascular:     Rate and Rhythm: Normal rate and regular rhythm.     Pulses: Normal pulses.     Heart sounds: Normal heart sounds.  Pulmonary:     Effort: No respiratory distress.     Breath sounds: Normal breath sounds.  Chest:     Chest wall:  Tenderness present.     Comments: Diffuse tenderness of the anterior and lateral chest. Symmetrical rise and fall of the chest. Pt speaks in complete sentences. Abdominal:     General: Bowel sounds are normal.     Palpations: Abdomen is soft.     Tenderness: There is no abdominal tenderness. There is no guarding.  Musculoskeletal: Normal range of motion.  Lymphadenopathy:     Head:     Right side of head: No submandibular adenopathy.     Left side of head: No submandibular adenopathy.     Cervical: No cervical adenopathy.  Skin:    General: Skin is warm and dry.  Neurological:     Mental Status: She is alert and oriented to person, place, and time.     Cranial Nerves: No cranial nerve deficit.     Sensory: No sensory deficit.  Psychiatric:        Speech: Speech normal.      ED Treatments / Results  Labs (all labs ordered are listed, but only abnormal results are displayed) Labs Reviewed  BASIC METABOLIC PANEL  CBC  POC URINE PREG, ED  TROPONIN I (HIGH SENSITIVITY)  TROPONIN I (HIGH SENSITIVITY)    EKG None  Radiology Dg Chest 2 View  Result Date: 03/11/2019 CLINICAL DATA:  38 year old female with chest pain. EXAM: CHEST - 2 VIEW COMPARISON:  Chest radiograph dated 11/06/2018 FINDINGS: The heart size and mediastinal contours are within normal limits. Both lungs are clear. The visualized skeletal structures are unremarkable.  IMPRESSION: No active cardiopulmonary disease. Electronically Signed   By: Elgie CollardArash  Radparvar M.D.   On: 03/11/2019 22:56    Procedures Procedures (including critical care time)  Medications Ordered in ED Medications  sodium chloride flush (NS) 0.9 % injection 3 mL (has no administration in time range)     Initial Impression / Assessment and Plan / ED Course  I have reviewed the triage vital signs and the nursing notes.  Pertinent labs & imaging results that were available during my care of the patient were reviewed by me and considered in my medical decision making (see chart for details).          Final Clinical Impressions(s) / ED Diagnoses MDM  Blood pressure elevated. PUlse ox wnl by my interpretation.  Pain easily reproduced with palpation as well as with range of motion involving the chest.  The patient has symmetrical rise and fall of the chest.  Patient speaks in complete sentences without problem.  Chest x-ray shows no active cardiopulmonary disease.  I discussed with the patient that she has chest wall tenderness, probably related to lifting, pushing, and pulling mostly at her job.  The patient is given a work note for the next few days.  She will be treated with muscle relaxer.  Questions were answered.  Patient is in agreement with this plan.   Final diagnoses:  Chest wall pain    ED Discharge Orders         Ordered    cyclobenzaprine (FLEXERIL) 10 MG tablet  3 times daily     03/12/19 0040    diclofenac (VOLTAREN) 75 MG EC tablet  2 times daily     03/12/19 0040           Ivery QualeBryant, Diantha Paxson, PA-C 03/12/19 1633    Zadie RhineWickline, Donald, MD 03/12/19 2306

## 2019-05-08 ENCOUNTER — Other Ambulatory Visit (HOSPITAL_COMMUNITY): Payer: Self-pay | Admitting: *Deleted

## 2019-05-08 ENCOUNTER — Other Ambulatory Visit: Payer: Self-pay | Admitting: *Deleted

## 2019-05-08 DIAGNOSIS — R1013 Epigastric pain: Secondary | ICD-10-CM

## 2019-05-08 DIAGNOSIS — R7989 Other specified abnormal findings of blood chemistry: Secondary | ICD-10-CM

## 2019-05-08 DIAGNOSIS — R0781 Pleurodynia: Secondary | ICD-10-CM

## 2019-05-13 ENCOUNTER — Ambulatory Visit (HOSPITAL_COMMUNITY): Payer: Self-pay

## 2019-05-18 ENCOUNTER — Encounter (HOSPITAL_COMMUNITY): Payer: Self-pay

## 2019-05-18 ENCOUNTER — Ambulatory Visit (HOSPITAL_COMMUNITY): Admission: RE | Admit: 2019-05-18 | Payer: Self-pay | Source: Ambulatory Visit

## 2019-06-30 ENCOUNTER — Ambulatory Visit: Payer: HRSA Program | Attending: Internal Medicine

## 2019-06-30 ENCOUNTER — Other Ambulatory Visit: Payer: Self-pay

## 2019-06-30 DIAGNOSIS — Z20822 Contact with and (suspected) exposure to covid-19: Secondary | ICD-10-CM | POA: Insufficient documentation

## 2019-07-01 LAB — NOVEL CORONAVIRUS, NAA: SARS-CoV-2, NAA: NOT DETECTED

## 2019-09-07 ENCOUNTER — Other Ambulatory Visit: Payer: Self-pay

## 2019-09-07 ENCOUNTER — Encounter (HOSPITAL_COMMUNITY): Payer: Self-pay | Admitting: Emergency Medicine

## 2019-09-07 ENCOUNTER — Emergency Department (HOSPITAL_COMMUNITY)
Admission: EM | Admit: 2019-09-07 | Discharge: 2019-09-08 | Disposition: A | Discharge status: Home / Self Care | Payer: Self-pay | Attending: Emergency Medicine | Admitting: Emergency Medicine

## 2019-09-07 DIAGNOSIS — R5383 Other fatigue: Secondary | ICD-10-CM | POA: Insufficient documentation

## 2019-09-07 DIAGNOSIS — I110 Hypertensive heart disease with heart failure: Secondary | ICD-10-CM | POA: Insufficient documentation

## 2019-09-07 DIAGNOSIS — Z79899 Other long term (current) drug therapy: Secondary | ICD-10-CM | POA: Insufficient documentation

## 2019-09-07 DIAGNOSIS — F17210 Nicotine dependence, cigarettes, uncomplicated: Secondary | ICD-10-CM | POA: Insufficient documentation

## 2019-09-07 DIAGNOSIS — R42 Dizziness and giddiness: Secondary | ICD-10-CM | POA: Insufficient documentation

## 2019-09-07 DIAGNOSIS — E876 Hypokalemia: Secondary | ICD-10-CM

## 2019-09-07 DIAGNOSIS — Z20822 Contact with and (suspected) exposure to covid-19: Secondary | ICD-10-CM | POA: Insufficient documentation

## 2019-09-07 DIAGNOSIS — I5032 Chronic diastolic (congestive) heart failure: Secondary | ICD-10-CM | POA: Insufficient documentation

## 2019-09-07 LAB — CBC
HCT: 39.9 % (ref 36.0–46.0)
Hemoglobin: 12.9 g/dL (ref 12.0–15.0)
MCH: 29 pg (ref 26.0–34.0)
MCHC: 32.3 g/dL (ref 30.0–36.0)
MCV: 89.7 fL (ref 80.0–100.0)
Platelets: 311 10*3/uL (ref 150–400)
RBC: 4.45 MIL/uL (ref 3.87–5.11)
RDW: 14.4 % (ref 11.5–15.5)
WBC: 7.4 10*3/uL (ref 4.0–10.5)
nRBC: 0 % (ref 0.0–0.2)

## 2019-09-07 LAB — BASIC METABOLIC PANEL
Anion gap: 10 (ref 5–15)
BUN: 11 mg/dL (ref 6–20)
CO2: 25 mmol/L (ref 22–32)
Calcium: 9.5 mg/dL (ref 8.9–10.3)
Chloride: 100 mmol/L (ref 98–111)
Creatinine, Ser: 0.75 mg/dL (ref 0.44–1.00)
GFR calc Af Amer: >60 mL/min (ref >60)
GFR calc non Af Amer: >60 mL/min (ref >60)
Glucose, Bld: 93 mg/dL (ref 70–99)
Potassium: 3.1 mmol/L — ABNORMAL LOW (ref 3.5–5.1)
Sodium: 135 mmol/L (ref 135–145)

## 2019-09-07 MED ORDER — POTASSIUM CHLORIDE CRYS ER 20 MEQ PO TBCR
40.0000 meq | EXTENDED_RELEASE_TABLET | Freq: Once | ORAL | Status: AC
  Administered 2019-09-07: 40 meq via ORAL
  Filled 2019-09-07 (×2): qty 2

## 2019-09-07 MED ORDER — SODIUM CHLORIDE 0.9 % IV BOLUS
1000.0000 mL | Freq: Once | INTRAVENOUS | Status: AC
  Administered 2019-09-07: 1000 mL via INTRAVENOUS

## 2019-09-07 NOTE — ED Triage Notes (Signed)
Pt states for the last 3 days shes been very tired, dizziness, headaches and "spots in her eyes".

## 2019-09-07 NOTE — ED Notes (Signed)
Called pt x 1

## 2019-09-08 LAB — SARS CORONAVIRUS 2 (TAT 6-24 HRS): SARS Coronavirus 2: NEGATIVE

## 2019-09-08 LAB — TSH: TSH: 3.314 u[IU]/mL (ref 0.350–4.500)

## 2019-09-08 NOTE — Discharge Instructions (Signed)
You were seen today for dizziness and fatigue.  Your work-up is generally reassuring.  You did have slightly low potassium.  Increase potassium rich foods in your diet.  Your thyroid testing was normal.  Make sure that you are staying hydrated.  Covid testing was sent given your vague symptoms.  Check MyChart for these results.  In the meantime quarantine.

## 2019-09-08 NOTE — ED Provider Notes (Signed)
[facility] EMERGENCY DEPARTMENT Provider Note   CSN: [id number] Arrival date & time: 09/07/19  1939     History Chief Complaint  Patient presents with  . Fatigue    [name] is a 39 y.o. female.  HPI     This is a 39 year old female with a history of hypertension, anemia who presents with generalized fatigue.  Patient states that she has had a 2 to 3-day history of just "not being able to get out of bed.  She reports generalized fatigue.  She also reports dizziness.  She describes it as lightheadedness.  She has not experienced any room spinning dizziness.  She has had associated mild headache and "spots in my eyes.  No known history of headaches.  Denies any weakness, numbness, strokelike symptoms.  No known history of thyroid disease.  She has not had any fevers, cough, upper respiratory symptoms.  No known sick contacts or Covid exposures.  Past Medical History:  Diagnosis Date  . Anemia   . Diastolic dysfunction 09/28/2014   Grade 2. Mod LVH. EF 60%  . GERD (gastroesophageal reflux disease)   . Hypertension   . Hypertensive urgency 09/26/2014  . 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 MRI brain and C-spine unremarkable  . Microcytic anemia 09/27/2014   Heavy menstrual periods  . Preeclampsia     x3    Patient Active Problem List   Diagnosis Date Noted  . 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 Menorrhagia 11/14/2017  . Acute on chronic blood loss anemia 11/14/2017  . Hypokalemia 11/14/2017  . Hypertension 10/01/2014  . Anemia 10/01/2014  . Anxiety and depression 10/01/2014  . Diastolic dysfunction 09/29/2014  . Syncope 09/27/2014  . Left sided numbness 09/27/2014  . Chest pressure 09/27/2014  . Microcytic anemia 09/27/2014  . Hypertensive urgency 09/26/2014  . TIA (transient ischemic attack) 09/26/2014  . Tobacco abuse 09/26/2014  . 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 Preeclampsia     Past Surgical History:  Procedure  Laterality Date  . c-sections    . CESAREAN SECTION     x 3  . DILITATION & CURRETTAGE/HYSTROSCOPY WITH NOVASURE ABLATION N/A 01/15/2018   Procedure: DILATATION & CURETTAGE/HYSTEROSCOPY WITH MINERVA ABLATION;  Surgeon: Lazaro Arms, MD;  Location: [facility];  Service: Gynecology;  Laterality: N/A;  . TUBAL LIGATION     2012     OB History    Gravida  3   Para  3   Term  2   Preterm  1   AB      Living  2     SAB      TAB      Ectopic      Multiple      Live Births  2           Family History  Problem Relation Age of Onset  . Hypertension Mother   . Diabetes Mother   . Anemia Mother   . Hypertension Father   . Diabetes Father   . Heart attack Paternal Grandfather   . Hypertension Paternal Grandmother   . Diabetes Paternal Grandmother   . Cancer Maternal Grandmother   . Alcohol abuse Maternal Grandfather   . Anemia Sister   . Hypertension Sister     Social History   Tobacco Use  . Smoking status: Current Every Day Smoker    Packs/day: 0.25    Years: 10.00    Pack years: 2.50  Types: Cigarettes  . Smokeless tobacco: Never Used  Substance Use Topics  . Alcohol use: Not Currently  . Drug use: Never    Home Medications Prior to Admission medications   Medication Sig Start Date End Date Taking? Authorizing Provider  amLODipine (NORVASC) 10 MG tablet Take 1 tablet (10 mg total) by mouth daily. 11/07/18  Yes Triplett, Tammy, PA-C  lisinopril-hydrochlorothiazide (ZESTORETIC) 20-25 MG tablet Take 1 tablet by mouth daily. 11/07/18  Yes Triplett, Tammy, PA-C    Allergies    Patient has no known allergies.  Review of Systems   Review of Systems  Constitutional: Positive for fatigue. Negative for fever.  Respiratory: Negative for shortness of breath.   Cardiovascular: Negative for chest pain.  Gastrointestinal: Negative for abdominal pain, nausea and vomiting.  Genitourinary: Negative for dysuria.  Neurological: Positive for dizziness,  light-headedness and headaches. Negative for numbness.  All other systems reviewed and are negative.   Physical Exam Updated Vital Signs BP (!) 152/94 (BP Location: Left Arm)   Pulse 72   Temp 98.6 F (37 C) (Oral)   Resp 14   Ht 1.676 m (5\' 6" )   Wt 90.7 kg   SpO2 100%   BMI 32.28 kg/m   Physical Exam Vitals and nursing note reviewed.  Constitutional:      General: She is not in acute distress.    Appearance: She is well-developed. She is not ill-appearing.  HENT:     Head: Normocephalic and atraumatic.     Nose: Nose normal.     Mouth/Throat:     Mouth: Mucous membranes are moist.  Eyes:     Extraocular Movements: Extraocular movements intact.     Pupils: Pupils are equal, round, and reactive to light.  Cardiovascular:     Rate and Rhythm: Normal rate and regular rhythm.     Heart sounds: Normal heart sounds.  Pulmonary:     Effort: Pulmonary effort is normal. No respiratory distress.     Breath sounds: No wheezing.  Abdominal:     General: Bowel sounds are normal.     Palpations: Abdomen is soft.     Tenderness: There is no abdominal tenderness.  Musculoskeletal:     Cervical back: Neck supple.     Right lower leg: No edema.     Left lower leg: No edema.  Skin:    General: Skin is warm and dry.  Neurological:     Mental Status: She is alert and oriented to person, place, and time.     Comments: Cranial nerves II through XII intact, 5 out of 5 strength in all 4 extremities, no dysmetria to finger-nose-finger  Psychiatric:        Mood and Affect: Mood normal.     ED Results / Procedures / Treatments   Labs (all labs ordered are listed, but only abnormal results are displayed) Labs Reviewed  BASIC METABOLIC PANEL - Abnormal; Notable for the following components:      Result Value   Potassium 3.1 (*)    All other components within normal limits  SARS CORONAVIRUS 2 (TAT 6-24 HRS)  CBC  TSH  URINALYSIS, ROUTINE W REFLEX MICROSCOPIC     EKG None  Radiology No results found.  Procedures Procedures (including critical care time)  Medications Ordered in ED Medications  sodium chloride 0.9 % bolus 1,000 mL (0 mLs Intravenous Stopped 09/08/19 0049)  potassium chloride SA (KLOR-CON) CR tablet 40 mEq (40 mEq Oral Given 09/07/19 2350)    ED  Course  I have reviewed the triage vital signs and the nursing notes.  Pertinent labs & imaging results that were available during my care of the patient were reviewed by me and considered in my medical decision making (see chart for details).    MDM Rules/Calculators/A&P                       Patient presents with generalized fatigue, headache, dizziness.  She is overall nontoxic and vital signs notable for blood pressure 152/94.  She is neurologically intact.  Basic lab work obtained including thyroid studies.  This shows mild hypokalemia at 3.1.  Doubt infectious process given that she is afebrile and has no other infectious symptoms but given current pandemic we will send a Covid test given her vague symptoms.  Patient was given fluids and potassium.  On recheck, she states she feels much better and remains intact on physical exam.  Suspect mild dehydration and hypokalemia as contributors.  No other red flags for headache.  Doubt subarachnoid hemorrhage or meningitis.  Will discharge home.  Encouraged to increase potassium in diet.  Covid testing is pending.  She was told to quarantine until test return.  After history, exam, and medical workup I feel the patient has been appropriately medically screened and is safe for discharge home. Pertinent diagnoses were discussed with the patient. Patient was given return precautions.  ALESSA MAZUR was evaluated in Emergency Department on 09/08/2019 for the symptoms described in the history of present illness. She was evaluated in the context of the global COVID-19 pandemic, which necessitated consideration that the patient might be at risk  for infection with the SARS-CoV-2 virus that causes COVID-19. Institutional protocols and algorithms that pertain to the evaluation of patients at risk for COVID-19 are in a state of rapid change based on information released by regulatory bodies including the CDC and federal and state organizations. These policies and algorithms were followed during the patient's care in the ED.   Final Clinical Impression(s) / ED Diagnoses Final diagnoses:  Fatigue, unspecified type  Dizziness  Hypokalemia    Rx / DC Orders ED Discharge Orders    None       Shon Baton, MD 09/08/19 0126

## 2019-09-18 IMAGING — CT CT HEAD W/O CM
3 series · 16 of 47 positions shown, 19 images · non-contrast
Comparison: 09/27/2014

CLINICAL DATA: Dizziness and headache for 3 days.

EXAM:
CT HEAD WITHOUT CONTRAST
TECHNIQUE: Contiguous axial images were obtained from the base of the skull
through the vertex without intravenous contrast.

[Series 2: head wo · axial · 0.47mm/px · z∈[+1489,+1619]mm · 10 of 32 slices shown, 13 images]
[im 3/32  brain]
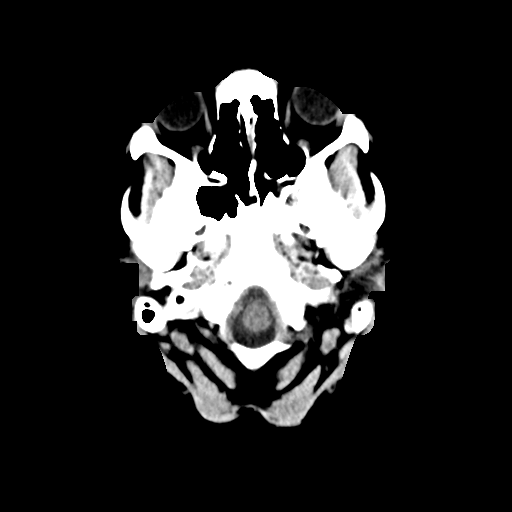
[im 3/32  bone]
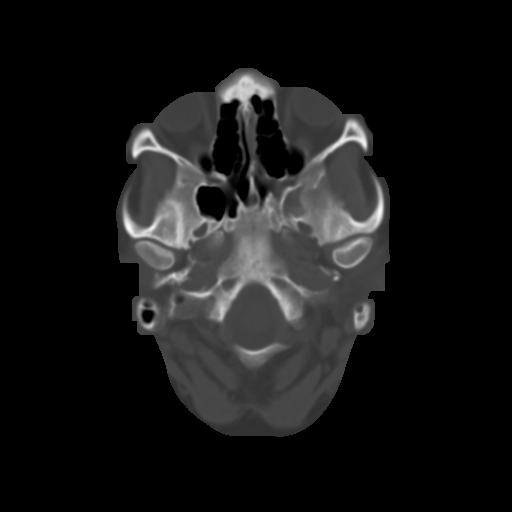
[im 6/32  brain]
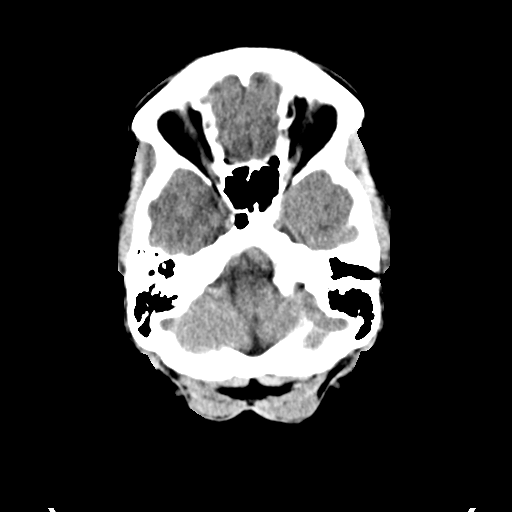
[im 9/32  brain]
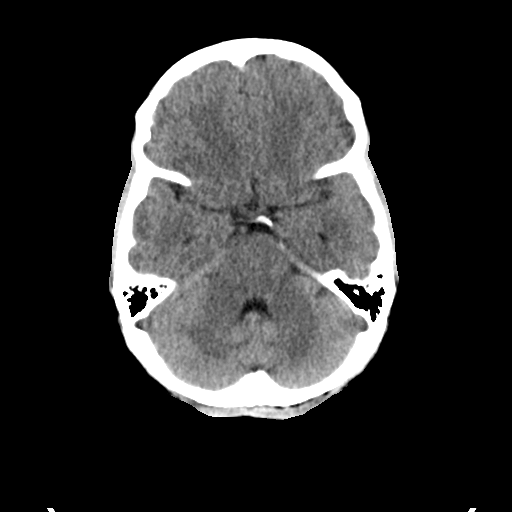
[im 11/32  brain]
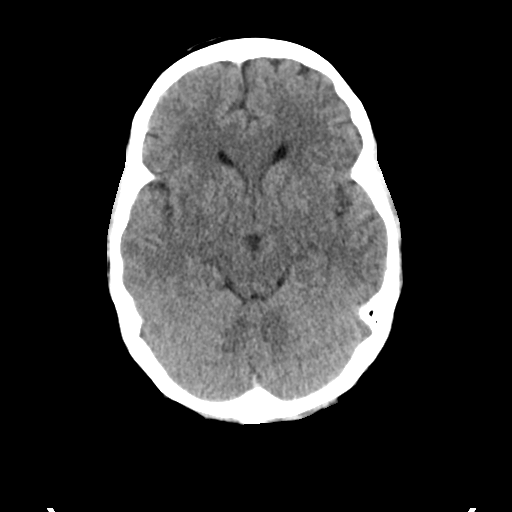
[im 14/32  brain]
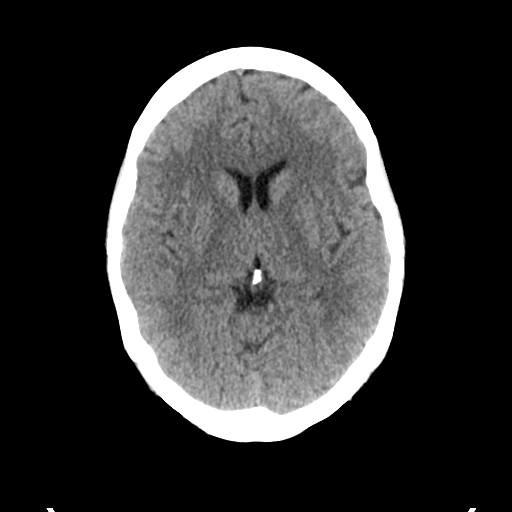
[im 14/32  bone]
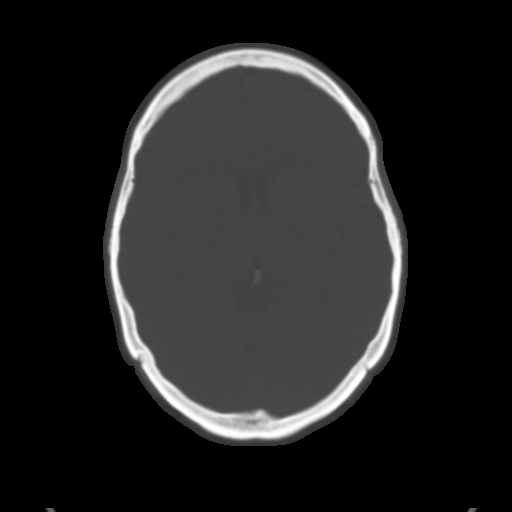
[im 18/32  brain]
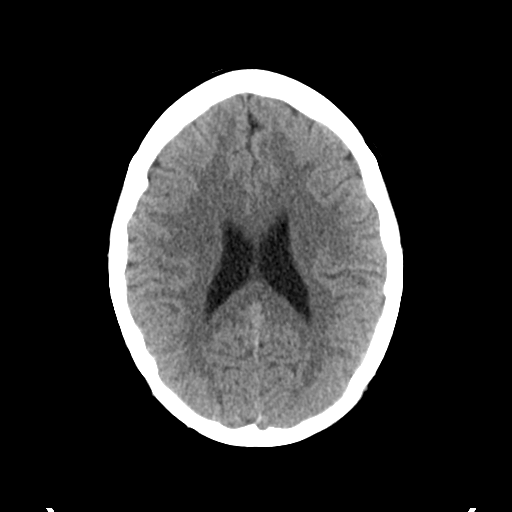
[im 21/32  brain]
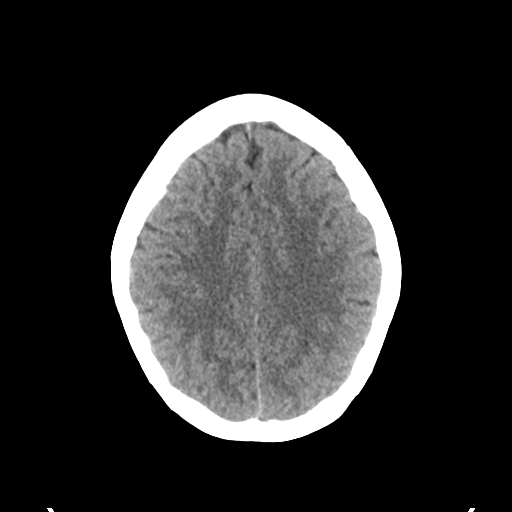
[im 24/32  brain]
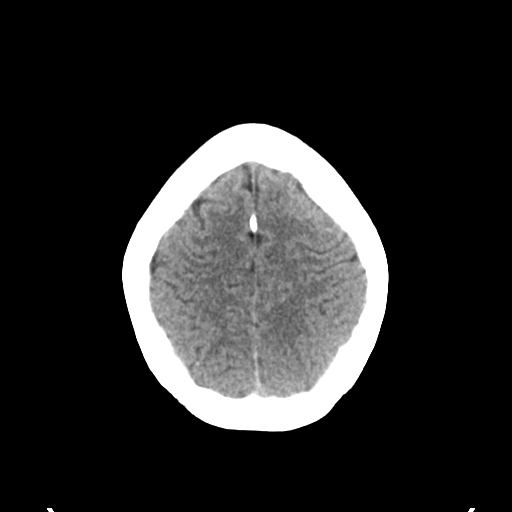
[im 26/32  brain]
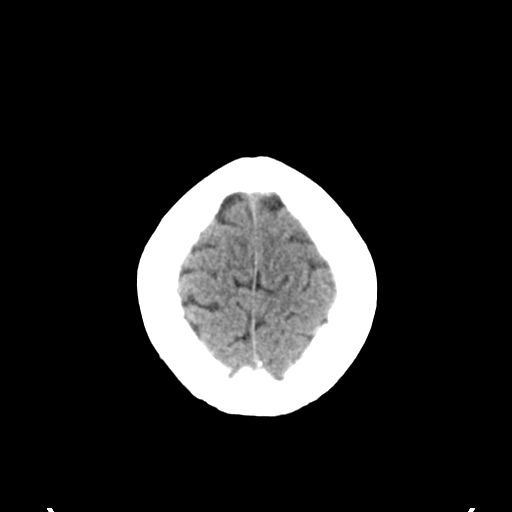
[im 26/32  bone]
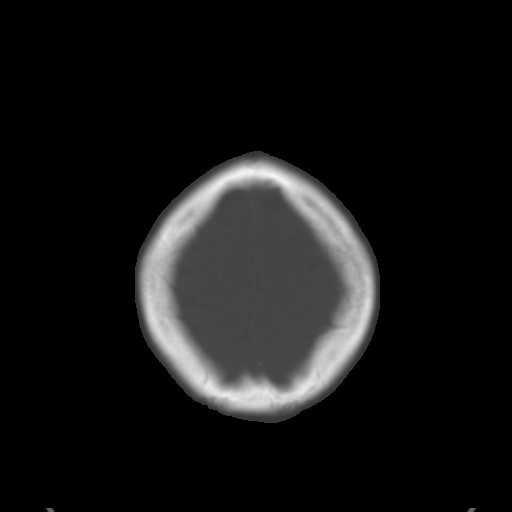
[im 29/32  brain]
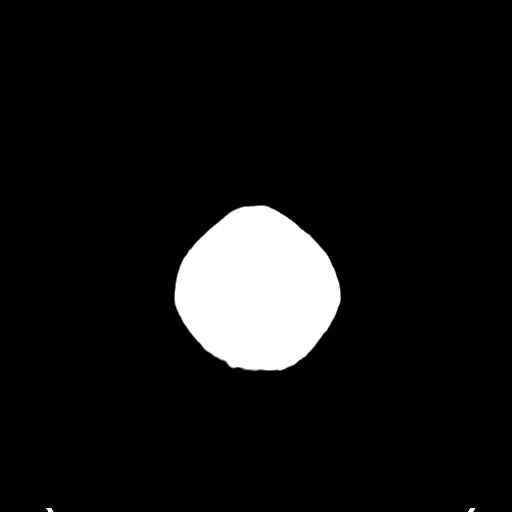

[Series 4: coronal soft tissue · coronal · 0.36mm/px · 3 of 70 slices shown]
[im 24/70  brain]
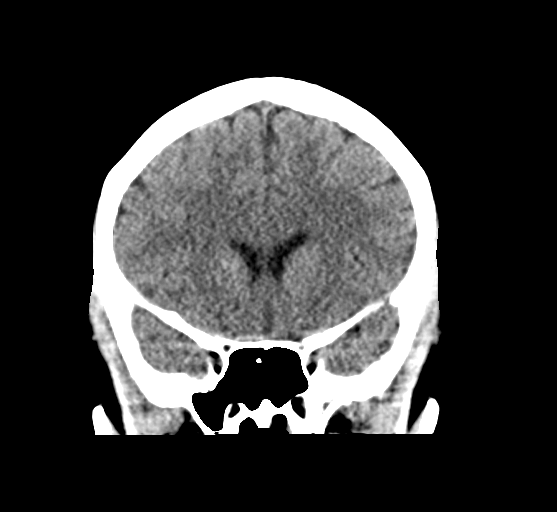
[im 31/70  brain]
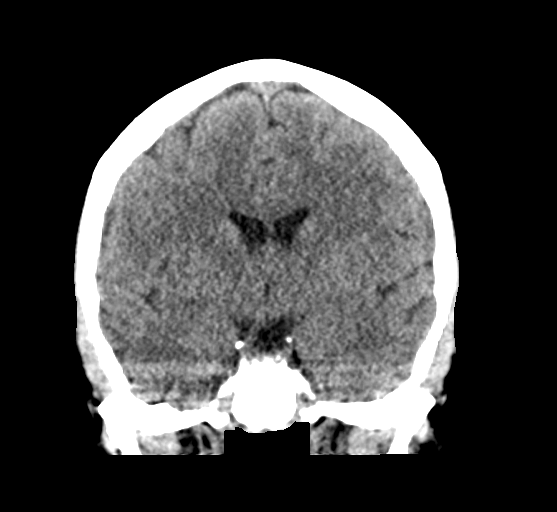
[im 39/70  brain]
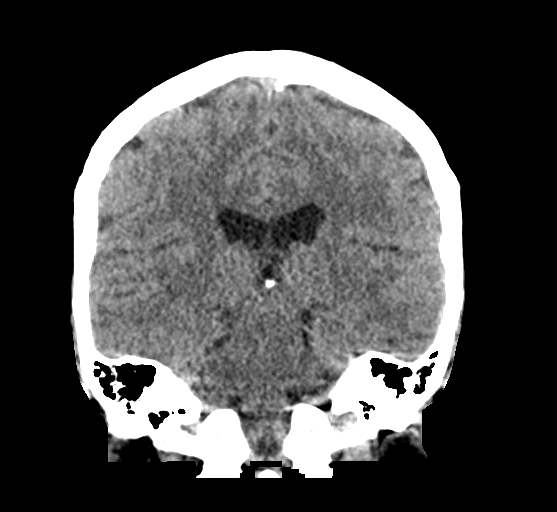

[Series 5: sagittal soft tissue · sagittal · 0.35mm/px · 3 of 58 slices shown]
[im 20/58  brain]
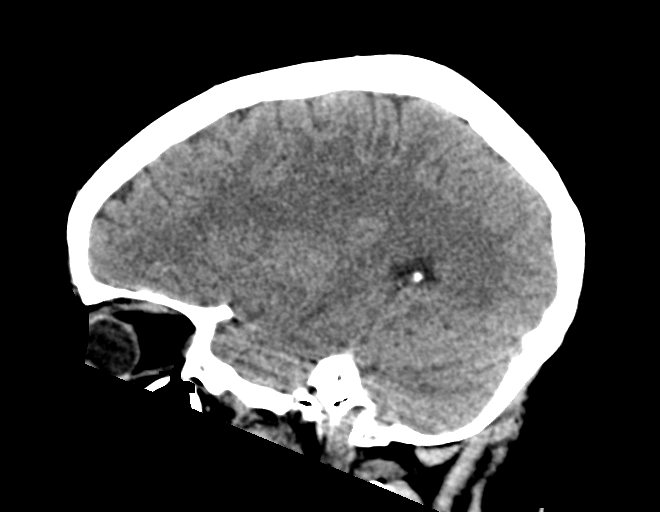
[im 29/58  brain]
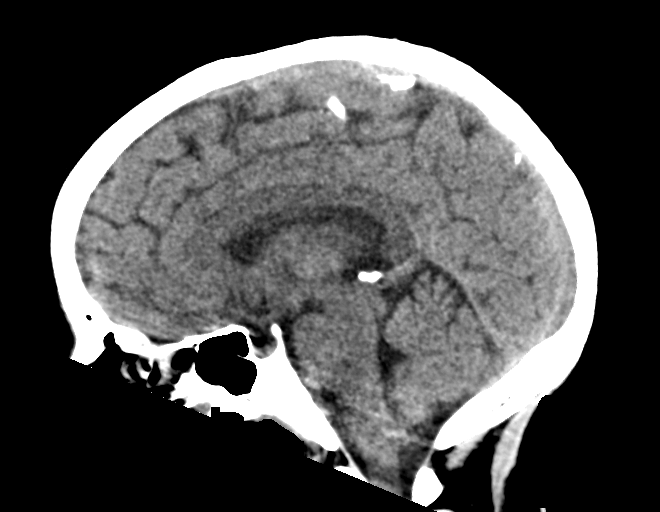
[im 39/58  brain]
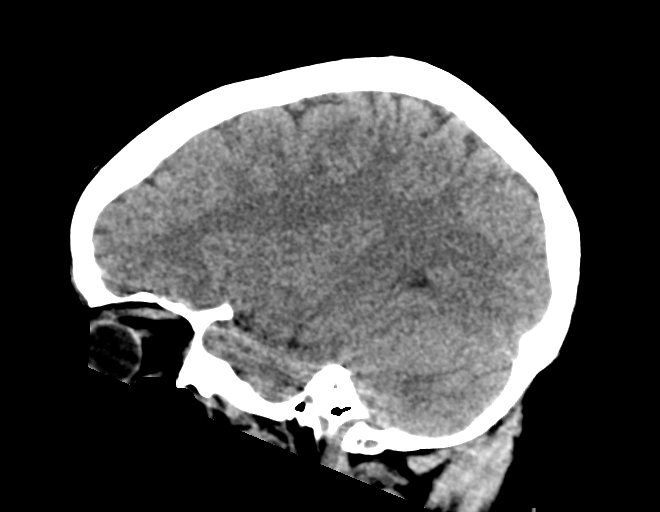

[16 of 47 positions shown; findings below may reference images not displayed]

FINDINGS: Brain: No evidence of acute infarction, hemorrhage, hydrocephalus,
extra-axial collection or mass lesion/mass effect.

Vascular: No hyperdense vessel or unexpected calcification.

Skull: Normal. Negative for fracture or focal lesion.

Sinuses/Orbits: No acute finding.

Other: None.
IMPRESSION: Normal exam.

## 2020-02-10 ENCOUNTER — Encounter (HOSPITAL_COMMUNITY): Payer: Self-pay

## 2020-02-10 ENCOUNTER — Other Ambulatory Visit: Payer: Self-pay

## 2020-02-10 ENCOUNTER — Emergency Department (HOSPITAL_COMMUNITY)
Admission: EM | Admit: 2020-02-10 | Discharge: 2020-02-10 | Disposition: A | Discharge status: Home / Self Care | Payer: Self-pay | Attending: Emergency Medicine | Admitting: Emergency Medicine

## 2020-02-10 DIAGNOSIS — Z79899 Other long term (current) drug therapy: Secondary | ICD-10-CM | POA: Insufficient documentation

## 2020-02-10 DIAGNOSIS — F17210 Nicotine dependence, cigarettes, uncomplicated: Secondary | ICD-10-CM | POA: Insufficient documentation

## 2020-02-10 DIAGNOSIS — Z8673 Personal history of transient ischemic attack (TIA), and cerebral infarction without residual deficits: Secondary | ICD-10-CM | POA: Insufficient documentation

## 2020-02-10 DIAGNOSIS — Z20822 Contact with and (suspected) exposure to covid-19: Secondary | ICD-10-CM | POA: Insufficient documentation

## 2020-02-10 DIAGNOSIS — A084 Viral intestinal infection, unspecified: Secondary | ICD-10-CM | POA: Insufficient documentation

## 2020-02-10 DIAGNOSIS — I10 Essential (primary) hypertension: Secondary | ICD-10-CM | POA: Insufficient documentation

## 2020-02-10 LAB — CBC WITH DIFFERENTIAL/PLATELET
Abs Immature Granulocytes: 0.01 10*3/uL (ref 0.00–0.07)
Basophils Absolute: 0 10*3/uL (ref 0.0–0.1)
Basophils Relative: 0 %
Eosinophils Absolute: 0.2 10*3/uL (ref 0.0–0.5)
Eosinophils Relative: 3 %
HCT: 40.6 % (ref 36.0–46.0)
Hemoglobin: 12.8 g/dL (ref 12.0–15.0)
Immature Granulocytes: 0 %
Lymphocytes Relative: 37 %
Lymphs Abs: 2.2 10*3/uL (ref 0.7–4.0)
MCH: 28.1 pg (ref 26.0–34.0)
MCHC: 31.5 g/dL (ref 30.0–36.0)
MCV: 89.2 fL (ref 80.0–100.0)
Monocytes Absolute: 0.3 10*3/uL (ref 0.1–1.0)
Monocytes Relative: 4 %
Neutro Abs: 3.3 10*3/uL (ref 1.7–7.7)
Neutrophils Relative %: 56 %
Platelets: 318 10*3/uL (ref 150–400)
RBC: 4.55 MIL/uL (ref 3.87–5.11)
RDW: 14.8 % (ref 11.5–15.5)
WBC: 6 10*3/uL (ref 4.0–10.5)
nRBC: 0.3 % — ABNORMAL HIGH (ref 0.0–0.2)

## 2020-02-10 LAB — COMPREHENSIVE METABOLIC PANEL
ALT: 83 U/L — ABNORMAL HIGH (ref 0–44)
AST: 69 U/L — ABNORMAL HIGH (ref 15–41)
Albumin: 3.6 g/dL (ref 3.5–5.0)
Alkaline Phosphatase: 91 U/L (ref 38–126)
Anion gap: 10 (ref 5–15)
BUN: 10 mg/dL (ref 6–20)
CO2: 26 mmol/L (ref 22–32)
Calcium: 9.4 mg/dL (ref 8.9–10.3)
Chloride: 99 mmol/L (ref 98–111)
Creatinine, Ser: 0.79 mg/dL (ref 0.44–1.00)
GFR calc Af Amer: >60 mL/min (ref >60)
GFR calc non Af Amer: >60 mL/min (ref >60)
Glucose, Bld: 100 mg/dL — ABNORMAL HIGH (ref 70–99)
Potassium: 3.4 mmol/L — ABNORMAL LOW (ref 3.5–5.1)
Sodium: 135 mmol/L (ref 135–145)
Total Bilirubin: 0.4 mg/dL (ref 0.3–1.2)
Total Protein: 8.9 g/dL — ABNORMAL HIGH (ref 6.5–8.1)

## 2020-02-10 LAB — SARS CORONAVIRUS 2 BY RT PCR (HOSPITAL ORDER, PERFORMED IN ~~LOC~~ HOSPITAL LAB): SARS Coronavirus 2: NEGATIVE

## 2020-02-10 MED ORDER — ONDANSETRON 4 MG PO TBDP
4.0000 mg | ORAL_TABLET | Freq: Once | ORAL | Status: AC
  Administered 2020-02-10: 4 mg via ORAL
  Filled 2020-02-10: qty 1

## 2020-02-10 MED ORDER — ONDANSETRON HCL 4 MG PO TABS: 4.0000 mg | ORAL_TABLET | Freq: Four times a day (QID) | ORAL | 0 refills | Status: DC

## 2020-02-10 NOTE — Discharge Instructions (Signed)
Drink plenty of fluids.  Follow up next week if not improving. °

## 2020-02-10 NOTE — ED Notes (Signed)
Patient states nausea medication has helped.

## 2020-02-10 NOTE — ED Provider Notes (Signed)
[facility] EMERGENCY DEPARTMENT Provider Note   CSN: [id number] Arrival date & time: 02/10/20  4128     History Chief Complaint  Patient presents with  . Chills    [name] is a 39 y.o. female.  Patient complains of nausea vomiting diarrhea for couple days.  She also had fevers and chills.  The vomiting has stopped   Emesis Severity:  Moderate Timing:  Constant Quality:  Stomach contents Able to tolerate:  Liquids Progression:  Improving Chronicity:  New Recent urination:  Decreased Relieved by:  Nothing Worsened by:  Nothing Ineffective treatments:  None tried Associated symptoms: diarrhea   Associated symptoms: no abdominal pain, no cough and no headaches        Past Medical History:  Diagnosis Date  . Anemia   . Diastolic dysfunction 09/28/2014   Grade 2. Mod LVH. EF 60%  . GERD (gastroesophageal reflux disease)   . Hypertension   . Hypertensive urgency 09/26/2014  . 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 MRI brain and C-spine unremarkable  . Microcytic anemia 09/27/2014   Heavy menstrual periods  . Preeclampsia     x3    Patient Active Problem List   Diagnosis Date Noted  . 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 Menorrhagia 11/14/2017  . Acute on chronic blood loss anemia 11/14/2017  . Hypokalemia 11/14/2017  . Hypertension 10/01/2014  . Anemia 10/01/2014  . Anxiety and depression 10/01/2014  . Diastolic dysfunction 09/29/2014  . Syncope 09/27/2014  . Left sided numbness 09/27/2014  . Chest pressure 09/27/2014  . Microcytic anemia 09/27/2014  . Hypertensive urgency 09/26/2014  . TIA (transient ischemic attack) 09/26/2014  . Tobacco abuse 09/26/2014  . 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 Preeclampsia     Past Surgical History:  Procedure Laterality Date  . c-sections    . CESAREAN SECTION     x 3  . DILITATION & CURRETTAGE/HYSTROSCOPY WITH NOVASURE ABLATION N/A 01/15/2018   Procedure: DILATATION &  CURETTAGE/HYSTEROSCOPY WITH MINERVA ABLATION;  Surgeon: Lazaro Arms, MD;  Location: [facility];  Service: Gynecology;  Laterality: N/A;  . TUBAL LIGATION     2012     OB History    Gravida  3   Para  3   Term  2   Preterm  1   AB      Living  2     SAB      TAB      Ectopic      Multiple      Live Births  2           Family History  Problem Relation Age of Onset  . Hypertension Mother   . Diabetes Mother   . Anemia Mother   . Hypertension Father   . Diabetes Father   . Heart attack Paternal Grandfather   . Hypertension Paternal Grandmother   . Diabetes Paternal Grandmother   . Cancer Maternal Grandmother   . Alcohol abuse Maternal Grandfather   . Anemia Sister   . Hypertension Sister     Social History   Tobacco Use  . Smoking status: Current Every Day Smoker    Packs/day: 0.25    Years: 10.00    Pack years: 2.50    Types: Cigarettes  . Smokeless tobacco: Never Used  Vaping Use  . Vaping Use: Never used  Substance Use Topics  . Alcohol use: Not Currently  . Drug use: Never    Home  Medications Prior to Admission medications   Medication Sig Start Date End Date Taking? Authorizing Provider  amLODipine (NORVASC) 10 MG tablet Take 1 tablet (10 mg total) by mouth daily. 11/07/18  Yes Triplett, Tammy, PA-C  lisinopril-hydrochlorothiazide (ZESTORETIC) 20-25 MG tablet Take 1 tablet by mouth daily. 11/07/18  Yes Triplett, Tammy, PA-C  ondansetron (ZOFRAN) 4 MG tablet Take 1 tablet (4 mg total) by mouth every 6 (six) hours. 02/10/20   Bethann Berkshire, MD    Allergies    Patient has no known allergies.  Review of Systems   Review of Systems  Constitutional: Negative for appetite change and fatigue.  HENT: Negative for congestion, ear discharge and sinus pressure.   Eyes: Negative for discharge.  Respiratory: Negative for cough.   Cardiovascular: Negative for chest pain.  Gastrointestinal: Positive for diarrhea and vomiting. Negative for abdominal  pain.  Genitourinary: Negative for frequency and hematuria.  Musculoskeletal: Negative for back pain.  Skin: Negative for rash.  Neurological: Negative for seizures and headaches.  Psychiatric/Behavioral: Negative for hallucinations.    Physical Exam Updated Vital Signs BP (!) 144/97   Pulse 70   Temp 98.9 F (37.2 C) (Oral)   Resp 18   Ht 5\' 6"  (1.676 m)   Wt 95.3 kg   LMP 02/09/2020   SpO2 99%   BMI 33.89 kg/m   Physical Exam Vitals and nursing note reviewed.  Constitutional:      Appearance: She is well-developed.  HENT:     Head: Normocephalic.     Nose: Nose normal.  Eyes:     General: No scleral icterus.    Conjunctiva/sclera: Conjunctivae normal.  Neck:     Thyroid: No thyromegaly.  Cardiovascular:     Rate and Rhythm: Normal rate and regular rhythm.     Heart sounds: No murmur heard.  No friction rub. No gallop.   Pulmonary:     Breath sounds: No stridor. No wheezing or rales.  Chest:     Chest wall: No tenderness.  Abdominal:     General: There is no distension.     Tenderness: There is no abdominal tenderness. There is no rebound.  Musculoskeletal:        General: Normal range of motion.     Cervical back: Neck supple.  Lymphadenopathy:     Cervical: No cervical adenopathy.  Skin:    Findings: No erythema or rash.  Neurological:     Mental Status: She is alert and oriented to person, place, and time.     Motor: No abnormal muscle tone.     Coordination: Coordination normal.  Psychiatric:        Behavior: Behavior normal.     ED Results / Procedures / Treatments   Labs (all labs ordered are listed, but only abnormal results are displayed) Labs Reviewed  CBC WITH DIFFERENTIAL/PLATELET - Abnormal; Notable for the following components:      Result Value   nRBC 0.3 (*)    All other components within normal limits  COMPREHENSIVE METABOLIC PANEL - Abnormal; Notable for the following components:   Potassium 3.4 (*)    Glucose, Bld 100 (*)     Total Protein 8.9 (*)    AST 69 (*)    ALT 83 (*)    All other components within normal limits  SARS CORONAVIRUS 2 BY RT PCR (HOSPITAL ORDER, PERFORMED IN [facility])    EKG None  Radiology No results found.  Procedures Procedures (including critical care time)  Medications Ordered in ED Medications  ondansetron (ZOFRAN-ODT) disintegrating tablet 4 mg (4 mg Oral Given 02/10/20 6433)    ED Course  I have reviewed the triage vital signs and the nursing notes.  Pertinent labs & imaging results that were available during my care of the patient were reviewed by me and considered in my medical decision making (see chart for details).    MDM Rules/Calculators/A&P                          Patient with negative Covid test.  Patient with viral gastroenteritis.  She will be sent home on Zofran will follow-up with her primary care doctor if not improving.  She is instructed to drink plenty of fluids        This patient presents to the ED for concern of vomiting diarrhea, this involves an extensive number of treatment options, and is a complaint that carries with it a high risk of complications and morbidity.  The differential diagnosis includes Covid or other virus   Lab Tests:   I Ordered, reviewed, and interpreted labs, which included CBC chemistries which show mild elevation of some liver enzymes and minimal low potassium  Medicines ordered:   I ordered medication Zofran for nausea  Imaging Studies ordered:     Additional history obtained:   Additional history obtained from records  Previous records obtained and reviewed.  Consultations Obtained:  Reevaluation:  After the interventions stated above, I reevaluated the patient and found mildly improved  Critical Interventions:  .   Final Clinical Impression(s) / ED Diagnoses Final diagnoses:  Viral gastroenteritis    Rx / DC Orders ED Discharge Orders         Ordered    ondansetron  (ZOFRAN) 4 MG tablet  Every 6 hours        02/10/20 1048           Bethann Berkshire, MD 02/10/20 1053

## 2020-02-10 NOTE — ED Triage Notes (Signed)
Pt reports chills, v/d since yesterday.  No v/d today.  Reports took first covid shot sept 3.

## 2021-07-24 ENCOUNTER — Encounter: Payer: Self-pay | Admitting: Nurse Practitioner

## 2021-07-24 ENCOUNTER — Other Ambulatory Visit: Payer: Self-pay

## 2021-07-24 ENCOUNTER — Ambulatory Visit: Payer: Self-pay | Admitting: Nurse Practitioner

## 2021-07-24 VITALS — BP 170/90 | HR 99 | Temp 97.2°F | Ht 65.0 in | Wt 209.4 lb

## 2021-07-24 DIAGNOSIS — Z13220 Encounter for screening for lipoid disorders: Secondary | ICD-10-CM | POA: Diagnosis not present

## 2021-07-24 DIAGNOSIS — F419 Anxiety disorder, unspecified: Secondary | ICD-10-CM

## 2021-07-24 DIAGNOSIS — F32A Depression, unspecified: Secondary | ICD-10-CM

## 2021-07-24 DIAGNOSIS — Z7689 Persons encountering health services in other specified circumstances: Secondary | ICD-10-CM

## 2021-07-24 DIAGNOSIS — E559 Vitamin D deficiency, unspecified: Secondary | ICD-10-CM | POA: Diagnosis not present

## 2021-07-24 DIAGNOSIS — N920 Excessive and frequent menstruation with regular cycle: Secondary | ICD-10-CM | POA: Diagnosis not present

## 2021-07-24 DIAGNOSIS — E876 Hypokalemia: Secondary | ICD-10-CM

## 2021-07-24 DIAGNOSIS — I10 Essential (primary) hypertension: Secondary | ICD-10-CM

## 2021-07-24 DIAGNOSIS — Z131 Encounter for screening for diabetes mellitus: Secondary | ICD-10-CM | POA: Diagnosis not present

## 2021-07-24 MED ORDER — POTASSIUM CHLORIDE CRYS ER 20 MEQ PO TBCR: 20.0000 meq | EXTENDED_RELEASE_TABLET | Freq: Two times a day (BID) | ORAL | 3 refills | Status: DC

## 2021-07-24 MED ORDER — AMLODIPINE BESYLATE 10 MG PO TABS: 10.0000 mg | ORAL_TABLET | Freq: Every day | ORAL | 1 refills | Status: DC

## 2021-07-24 MED ORDER — LISINOPRIL-HYDROCHLOROTHIAZIDE 20-25 MG PO TABS: 1.0000 | ORAL_TABLET | Freq: Every day | ORAL | 1 refills | Status: DC

## 2021-07-24 NOTE — Progress Notes (Addendum)
Subjective:    Patient ID: [name], female    DOB: [birth year], 41 y.o.   MRN: [id number]  HPI  Pt here to establish care. Pt was seeing Health Dept. Pt has been out of blood pressure med (Amlodipine; Zestoretic) for 2 weeks and states she "has been feeling funny".  Patient describes feeling funny as of fatigue.  Patient states that she also has been experiencing intermittent leg cramps.  The last time that she has had leg cramps when her potassium was low.  Patient states that she has a history of hypokalemia and has been taking over-the-counter potassium pills at the advice of the health department provider due to cost.  Patient recently got a job that has insurance and is now able to afford her medications.  Patient also states that she has history of heavy menstrual bleeding in which she has undergone uterine ablation which was helpful for about 6 months.    Patient states that she also has been taking vitamin D supplements for possible vitamin D deficiency.  Patient also states that she has history of anxiety that she manages on her own without difficulty.  Patient denies any chest pain, dizziness, lightheadedness, shortness of breath, changes to vision, weakness, slurred speech, headaches, leg swelling.  Review of Systems  Constitutional:  Positive for fatigue.  Musculoskeletal:        Intermittent leg cramps  All other systems reviewed and are negative.      Objective:   Physical Exam Constitutional:      General: She is not in acute distress.    Appearance: Normal appearance. She is obese. She is not ill-appearing or toxic-appearing.  Cardiovascular:     Rate and Rhythm: Normal rate and regular rhythm.     Pulses: Normal pulses.     Heart sounds: Normal heart sounds. No murmur heard. Pulmonary:     Effort: Pulmonary effort is normal.     Breath sounds: Normal breath sounds.  Musculoskeletal:        General: Normal range of motion.     Right lower leg: No edema.      Left lower leg: No edema.  Skin:    General: Skin is warm.     Capillary Refill: Capillary refill takes less than 2 seconds.  Neurological:     General: No focal deficit present.     Mental Status: She is alert and oriented to person, place, and time.  Psychiatric:        Mood and Affect: Mood normal.        Behavior: Behavior normal.          Assessment & Plan:   1. Encounter to establish care -Return to clinic in 2 weeks  2. HTN (hypertension), malignant -Blood pressure initially 170/190.  It was 150/98 on recheck.  Goal blood pressure 140/90 not met. -Restart blood pressure medications as prescribed. - amLODipine (NORVASC) 10 MG tablet; Take 1 tablet (10 mg total) by mouth daily.  Dispense: 90 tablet; Refill: 1 - lisinopril-hydrochlorothiazide (ZESTORETIC) 20-25 MG tablet; Take 1 tablet by mouth daily.  Dispense: 90 tablet; Refill: 1 - potassium chloride SA (KLOR-CON M) 20 MEQ tablet; Take 1 tablet (20 mEq total) by mouth 2 (two) times daily.  Dispense: 90 tablet; Refill: 3 - CMP14+EGFR - Urine Microalbumin w/creat. Ratio -Lipid Panel to stratify cardiovascular risk. -Return to clinic in 2 weeks  3. Hypokalemia -Patient reports history of hypokalemia. -We will recheck potassium levels today and supplement potassium as needed. -  We will consider taking patient off hydrochlorothiazide medication if potassium continues to be low. -Patient aware that it will be advised for her to go to the emergency room if potassium found to be severely low. - CMP14+EGFR -Patient advised to return to clinic in 2 weeks for evaluation.  Patient advised if she experiences increased muscle cramping, muscle weakness, chest pain, shortness of breath to return to clinic or go to emergency room.  4. Menorrhagia with regular cycle -Discussed possibility of using Depo or Mirena for menstrual control.  Patient encouraged to discuss options with GYN provider. - CBC with Differential/Platelet -  Ambulatory referral to Gynecology  5. Vitamin D deficiency - Vitamin D (25 hydroxy)  6. Diabetes mellitus screening -Diabetes screening needed to stratify cardiovascular risk risk - HgB A1c  7. Anxiety -Counseling offered today however patient declined stating that she is managing anxiety symptoms on her own without difficulty.

## 2021-07-25 LAB — HEMOGLOBIN A1C
Est. average glucose Bld gHb Est-mCnc: 120 mg/dL
Hgb A1c MFr Bld: 5.8 % — ABNORMAL HIGH (ref 4.8–5.6)

## 2021-07-25 LAB — CMP14+EGFR
ALT: 27 IU/L (ref 0–32)
AST: 23 IU/L (ref 0–40)
Albumin/Globulin Ratio: 1 — ABNORMAL LOW (ref 1.2–2.2)
Albumin: 4.3 g/dL (ref 3.8–4.8)
Alkaline Phosphatase: 118 IU/L (ref 44–121)
BUN/Creatinine Ratio: 8 — ABNORMAL LOW (ref 9–23)
BUN: 6 mg/dL (ref 6–24)
Bilirubin Total: <0.2 mg/dL (ref 0.0–1.2)
CO2: 22 mmol/L (ref 20–29)
Calcium: 9.5 mg/dL (ref 8.7–10.2)
Chloride: 104 mmol/L (ref 96–106)
Creatinine, Ser: 0.79 mg/dL (ref 0.57–1.00)
Globulin, Total: 4.2 g/dL (ref 1.5–4.5)
Glucose: 87 mg/dL (ref 70–99)
Potassium: 3.2 mmol/L — ABNORMAL LOW (ref 3.5–5.2)
Sodium: 141 mmol/L (ref 134–144)
Total Protein: 8.5 g/dL (ref 6.0–8.5)
eGFR: 97 mL/min/{1.73_m2} (ref >59)

## 2021-07-25 LAB — CBC WITH DIFFERENTIAL/PLATELET
Basophils Absolute: 0 10*3/uL (ref 0.0–0.2)
Basos: 1 %
EOS (ABSOLUTE): 0.2 10*3/uL (ref 0.0–0.4)
Eos: 3 %
Hematocrit: 39.4 % (ref 34.0–46.6)
Hemoglobin: 12.5 g/dL (ref 11.1–15.9)
Immature Grans (Abs): 0 10*3/uL (ref 0.0–0.1)
Immature Granulocytes: 0 %
Lymphocytes Absolute: 2.3 10*3/uL (ref 0.7–3.1)
Lymphs: 38 %
MCH: 27.5 pg (ref 26.6–33.0)
MCHC: 31.7 g/dL (ref 31.5–35.7)
MCV: 87 fL (ref 79–97)
Monocytes Absolute: 0.3 10*3/uL (ref 0.1–0.9)
Monocytes: 5 %
Neutrophils Absolute: 3.2 10*3/uL (ref 1.4–7.0)
Neutrophils: 53 %
Platelets: 309 10*3/uL (ref 150–450)
RBC: 4.55 x10E6/uL (ref 3.77–5.28)
RDW: 16.3 % — ABNORMAL HIGH (ref 11.7–15.4)
WBC: 6 10*3/uL (ref 3.4–10.8)

## 2021-07-25 LAB — MICROALBUMIN / CREATININE URINE RATIO

## 2021-07-25 LAB — VITAMIN D 25 HYDROXY (VIT D DEFICIENCY, FRACTURES): Vit D, 25-Hydroxy: 28.9 ng/mL — ABNORMAL LOW (ref 30.0–100.0)

## 2021-07-26 LAB — SPECIMEN STATUS REPORT

## 2021-07-27 LAB — LIPID PANEL
Chol/HDL Ratio: 3.9 ratio (ref 0.0–4.4)
Cholesterol, Total: 120 mg/dL (ref 100–199)
HDL: 31 mg/dL — ABNORMAL LOW (ref >39)
LDL Chol Calc (NIH): 60 mg/dL (ref 0–99)
Triglycerides: 172 mg/dL — ABNORMAL HIGH (ref 0–149)
VLDL Cholesterol Cal: 29 mg/dL (ref 5–40)

## 2021-07-27 LAB — SPECIMEN STATUS REPORT

## 2021-08-08 ENCOUNTER — Encounter: Payer: Self-pay | Admitting: Nurse Practitioner

## 2021-08-08 ENCOUNTER — Ambulatory Visit: Payer: BC Managed Care – PPO | Admitting: Nurse Practitioner

## 2021-10-26 ENCOUNTER — Ambulatory Visit: Payer: Self-pay | Admitting: Obstetrics & Gynecology

## 2021-11-09 ENCOUNTER — Ambulatory Visit: Payer: Self-pay | Admitting: Nurse Practitioner

## 2021-11-30 ENCOUNTER — Telehealth: Payer: Self-pay | Admitting: Nurse Practitioner

## 2021-11-30 DIAGNOSIS — I10 Essential (primary) hypertension: Secondary | ICD-10-CM

## 2021-11-30 NOTE — Telephone Encounter (Signed)
Please advise. Thank you

## 2021-11-30 NOTE — Telephone Encounter (Signed)
Patient is requesting refill on Blood pressure medication amlodipine 10 mg and lisinopril-hydrochlorothiazide 20-25 mg enough until next appointment on 7/12 she has missed the last two appointments. Medication lasted filled on 07/24/21. [facility] 

## 2021-12-01 MED ORDER — LISINOPRIL-HYDROCHLOROTHIAZIDE 20-25 MG PO TABS: 1.0000 | ORAL_TABLET | Freq: Every day | ORAL | 0 refills | Status: DC

## 2021-12-01 MED ORDER — AMLODIPINE BESYLATE 10 MG PO TABS: 10.0000 mg | ORAL_TABLET | Freq: Every day | ORAL | 0 refills | Status: DC

## 2021-12-01 NOTE — Telephone Encounter (Signed)
Left message to return call; also sent my chart message 

## 2021-12-01 NOTE — Telephone Encounter (Signed)
Per Epic pt was able to read my chart message 

## 2021-12-06 ENCOUNTER — Encounter: Payer: Self-pay | Admitting: Nurse Practitioner

## 2021-12-06 ENCOUNTER — Ambulatory Visit (INDEPENDENT_AMBULATORY_CARE_PROVIDER_SITE_OTHER): Payer: BC Managed Care – PPO | Admitting: Nurse Practitioner

## 2021-12-06 VITALS — BP 160/94 | HR 62 | Temp 97.7°F | Wt 200.0 lb

## 2021-12-06 DIAGNOSIS — E559 Vitamin D deficiency, unspecified: Secondary | ICD-10-CM | POA: Diagnosis not present

## 2021-12-06 DIAGNOSIS — E876 Hypokalemia: Secondary | ICD-10-CM

## 2021-12-06 DIAGNOSIS — I10 Essential (primary) hypertension: Secondary | ICD-10-CM

## 2021-12-06 DIAGNOSIS — N920 Excessive and frequent menstruation with regular cycle: Secondary | ICD-10-CM

## 2021-12-06 MED ORDER — POTASSIUM CHLORIDE CRYS ER 20 MEQ PO TBCR: 20.0000 meq | EXTENDED_RELEASE_TABLET | Freq: Two times a day (BID) | ORAL | 3 refills | Status: DC

## 2021-12-06 MED ORDER — AMLODIPINE BESYLATE 10 MG PO TABS: 10.0000 mg | ORAL_TABLET | Freq: Every day | ORAL | 1 refills | Status: AC

## 2021-12-06 MED ORDER — LISINOPRIL-HYDROCHLOROTHIAZIDE 20-25 MG PO TABS: 1.0000 | ORAL_TABLET | Freq: Every day | ORAL | 1 refills | Status: DC

## 2021-12-06 NOTE — Progress Notes (Signed)
Subjective:    Patient ID: [name], female    DOB: 25-Aug-1980, 41 y.o.   MRN: [id number]  HPI  Pt arrives for follow up on HTN. Pt states she has not had her meds in about a week. Pt states she is going to get her meds after appt. Pt states she feels good. No issues with blood pressure. Patient denies chest pain, SOB, difficulty breathing, headaches, changes to vision, or leg swelling.  Patient was referred to GYN during last visit for menorrhagia. However patient has not had a chance to be seen by them. Patient states that she continues to have heavy menstrual flow and that she is still interested in IUD or Depo for menstrual control. Patient denies any lightheadedness, SOB, or fatigue.  Patient has lost 9lbs since last visit and 30 lbs over all. Patient admits that she has been trying to loose weight by being more active and exercising.    Review of Systems  All other systems reviewed and are negative.      Objective:   Physical Exam Vitals reviewed.  Constitutional:      General: She is not in acute distress.    Appearance: Normal appearance. She is obese. She is not ill-appearing, toxic-appearing or diaphoretic.  HENT:     Head: Normocephalic and atraumatic.  Cardiovascular:     Rate and Rhythm: Normal rate and regular rhythm.     Pulses: Normal pulses.     Heart sounds: Normal heart sounds. No murmur heard. Pulmonary:     Effort: Pulmonary effort is normal. No respiratory distress.     Breath sounds: Normal breath sounds. No wheezing.  Musculoskeletal:     Comments: Grossly intact  Skin:    General: Skin is warm.     Capillary Refill: Capillary refill takes less than 2 seconds.  Neurological:     Mental Status: She is alert.     Comments: Grossly intact  Psychiatric:        Mood and Affect: Mood normal.        Behavior: Behavior normal.           Assessment & Plan:   1. Primary hypertension - BP today was initially 210/169 and on recheck 160/94. Not  at goal of 140-90 - Patient admitted to smoking cigarette prior to medical appointment. Patient denies any s/s of hypertensive emergency - Continue taking medication as prescribed.  - Patient stated difficulty returning to clinic in two weeks for BP follow up. Patient to complete BP log for 10 days and send to me via Mychart. - Vitamin D (25 hydroxy) - amLODipine (NORVASC) 10 MG tablet; Take 1 tablet (10 mg total) by mouth daily.  Dispense: 90 tablet; Refill: 1 - lisinopril-hydrochlorothiazide (ZESTORETIC) 20-25 MG tablet; Take 1 tablet by mouth daily.  Dispense: 90 tablet; Refill: 1 - potassium chloride SA (KLOR-CON M) 20 MEQ tablet; Take 1 tablet (20 mEq total) by mouth 2 (two) times daily.  Dispense: 90 tablet; Refill: 3 - RTC in 3 months or sooner if necessary due to results of labs or BP log.   2. Hypokalemia - Last potassium 3.2. Patient has been taking Potassium 20MEQ without difficulty.  - CMP14+EGFR - potassium chloride SA (KLOR-CON M) 20 MEQ tablet; Take 1 tablet (20 mEq total) by mouth 2 (two) times daily.  Dispense: 90 tablet; Refill: 3 - RTC in 3 months or sooner if necessary.   3. Menorrhagia with regular cycle - CBC with Differential  4. Vitamin  D Deficiency - Vit D lab    Note:  This document was prepared using Dragon voice recognition software and may include unintentional dictation errors. Note - This record has been created using [employer].  Chart creation errors have been sought, but may not always  have been located. Such creation errors do not reflect on  the standard of medical care.

## 2021-12-25 ENCOUNTER — Inpatient Hospital Stay (HOSPITAL_COMMUNITY)
Admission: EM | Admit: 2021-12-25 | Discharge: 2021-12-27 | DRG: 916 | Disposition: A | Discharge status: Home / Self Care | Payer: BC Managed Care – PPO | Attending: Internal Medicine | Admitting: Internal Medicine

## 2021-12-25 ENCOUNTER — Other Ambulatory Visit: Payer: Self-pay

## 2021-12-25 ENCOUNTER — Emergency Department (HOSPITAL_COMMUNITY): Payer: BC Managed Care – PPO

## 2021-12-25 ENCOUNTER — Encounter (HOSPITAL_COMMUNITY): Payer: Self-pay | Admitting: *Deleted

## 2021-12-25 DIAGNOSIS — Z809 Family history of malignant neoplasm, unspecified: Secondary | ICD-10-CM

## 2021-12-25 DIAGNOSIS — Z20822 Contact with and (suspected) exposure to covid-19: Secondary | ICD-10-CM | POA: Diagnosis not present

## 2021-12-25 DIAGNOSIS — J039 Acute tonsillitis, unspecified: Secondary | ICD-10-CM | POA: Diagnosis not present

## 2021-12-25 DIAGNOSIS — Z833 Family history of diabetes mellitus: Secondary | ICD-10-CM

## 2021-12-25 DIAGNOSIS — K219 Gastro-esophageal reflux disease without esophagitis: Secondary | ICD-10-CM | POA: Diagnosis not present

## 2021-12-25 DIAGNOSIS — R1310 Dysphagia, unspecified: Secondary | ICD-10-CM | POA: Diagnosis present

## 2021-12-25 DIAGNOSIS — I10 Essential (primary) hypertension: Secondary | ICD-10-CM | POA: Diagnosis present

## 2021-12-25 DIAGNOSIS — J028 Acute pharyngitis due to other specified organisms: Secondary | ICD-10-CM | POA: Diagnosis not present

## 2021-12-25 DIAGNOSIS — Z79899 Other long term (current) drug therapy: Secondary | ICD-10-CM | POA: Diagnosis not present

## 2021-12-25 DIAGNOSIS — F17210 Nicotine dependence, cigarettes, uncomplicated: Secondary | ICD-10-CM | POA: Diagnosis present

## 2021-12-25 DIAGNOSIS — E876 Hypokalemia: Secondary | ICD-10-CM | POA: Diagnosis not present

## 2021-12-25 DIAGNOSIS — Z8249 Family history of ischemic heart disease and other diseases of the circulatory system: Secondary | ICD-10-CM | POA: Diagnosis not present

## 2021-12-25 DIAGNOSIS — J029 Acute pharyngitis, unspecified: Secondary | ICD-10-CM | POA: Diagnosis not present

## 2021-12-25 DIAGNOSIS — E041 Nontoxic single thyroid nodule: Secondary | ICD-10-CM | POA: Diagnosis not present

## 2021-12-25 DIAGNOSIS — T464X5A Adverse effect of angiotensin-converting-enzyme inhibitors, initial encounter: Secondary | ICD-10-CM | POA: Diagnosis present

## 2021-12-25 DIAGNOSIS — T783XXA Angioneurotic edema, initial encounter: Principal | ICD-10-CM | POA: Diagnosis present

## 2021-12-25 DIAGNOSIS — Z811 Family history of alcohol abuse and dependence: Secondary | ICD-10-CM

## 2021-12-25 LAB — CBC WITH DIFFERENTIAL/PLATELET
Abs Immature Granulocytes: 0.01 10*3/uL (ref 0.00–0.07)
Basophils Absolute: 0 10*3/uL (ref 0.0–0.1)
Basophils Relative: 1 %
Eosinophils Absolute: 0.2 10*3/uL (ref 0.0–0.5)
Eosinophils Relative: 3 %
HCT: 37.5 % (ref 36.0–46.0)
Hemoglobin: 12.4 g/dL (ref 12.0–15.0)
Immature Granulocytes: 0 %
Lymphocytes Relative: 44 %
Lymphs Abs: 2.5 10*3/uL (ref 0.7–4.0)
MCH: 29.4 pg (ref 26.0–34.0)
MCHC: 33.1 g/dL (ref 30.0–36.0)
MCV: 88.9 fL (ref 80.0–100.0)
Monocytes Absolute: 0.3 10*3/uL (ref 0.1–1.0)
Monocytes Relative: 6 %
Neutro Abs: 2.7 10*3/uL (ref 1.7–7.7)
Neutrophils Relative %: 46 %
Platelets: 232 10*3/uL (ref 150–400)
RBC: 4.22 MIL/uL (ref 3.87–5.11)
RDW: 15.9 % — ABNORMAL HIGH (ref 11.5–15.5)
WBC: 5.6 10*3/uL (ref 4.0–10.5)
nRBC: 0 % (ref 0.0–0.2)

## 2021-12-25 LAB — BASIC METABOLIC PANEL
Anion gap: 6 (ref 5–15)
BUN: 12 mg/dL (ref 6–20)
CO2: 26 mmol/L (ref 22–32)
Calcium: 8.9 mg/dL (ref 8.9–10.3)
Chloride: 106 mmol/L (ref 98–111)
Creatinine, Ser: 0.86 mg/dL (ref 0.44–1.00)
GFR, Estimated: >60 mL/min (ref >60)
Glucose, Bld: 95 mg/dL (ref 70–99)
Potassium: 2.9 mmol/L — ABNORMAL LOW (ref 3.5–5.1)
Sodium: 138 mmol/L (ref 135–145)

## 2021-12-25 LAB — HCG, QUANTITATIVE, PREGNANCY: hCG, Beta Chain, Quant, S: <1 m[IU]/mL (ref <5)

## 2021-12-25 LAB — GROUP A STREP BY PCR: Group A Strep by PCR: NOT DETECTED

## 2021-12-25 MED ORDER — EPINEPHRINE 0.3 MG/0.3ML IJ SOAJ
0.3000 mg | Freq: Once | INTRAMUSCULAR | Status: AC
  Administered 2021-12-25: 0.3 mg via INTRAMUSCULAR
  Filled 2021-12-25: qty 0.3

## 2021-12-25 MED ORDER — AMOXICILLIN-POT CLAVULANATE 875-125 MG PO TABS: 1.0000 | ORAL_TABLET | Freq: Two times a day (BID) | ORAL | Status: DC

## 2021-12-25 MED ORDER — POTASSIUM CHLORIDE CRYS ER 20 MEQ PO TBCR
40.0000 meq | EXTENDED_RELEASE_TABLET | Freq: Once | ORAL | Status: AC
  Administered 2021-12-26: 40 meq via ORAL
  Filled 2021-12-25: qty 2

## 2021-12-25 MED ORDER — METHYLPREDNISOLONE SODIUM SUCC 125 MG IJ SOLR
125.0000 mg | Freq: Once | INTRAMUSCULAR | Status: AC
  Administered 2021-12-25: 125 mg via INTRAVENOUS
  Filled 2021-12-25: qty 2

## 2021-12-25 MED ORDER — IOHEXOL 300 MG/ML  SOLN
75.0000 mL | Freq: Once | INTRAMUSCULAR | Status: AC | PRN
  Administered 2021-12-25: 75 mL via INTRAVENOUS

## 2021-12-25 MED ORDER — SODIUM CHLORIDE 0.9 % IV BOLUS
500.0000 mL | Freq: Once | INTRAVENOUS | Status: AC
  Administered 2021-12-25: 500 mL via INTRAVENOUS

## 2021-12-25 MED ORDER — SODIUM CHLORIDE 0.9 % IV SOLN
2.0000 g | Freq: Once | INTRAVENOUS | Status: AC
  Administered 2021-12-25: 2 g via INTRAVENOUS
  Filled 2021-12-25: qty 20

## 2021-12-25 MED ORDER — FAMOTIDINE IN NACL 20-0.9 MG/50ML-% IV SOLN
20.0000 mg | Freq: Once | INTRAVENOUS | Status: AC
  Administered 2021-12-25: 20 mg via INTRAVENOUS
  Filled 2021-12-25: qty 50

## 2021-12-25 MED ORDER — HEPARIN SODIUM (PORCINE) 5000 UNIT/ML IJ SOLN
5000.0000 [IU] | Freq: Three times a day (TID) | INTRAMUSCULAR | Status: DC
  Filled 2021-12-25 (×2): qty 1

## 2021-12-25 MED ORDER — DIPHENHYDRAMINE HCL 50 MG/ML IJ SOLN
50.0000 mg | Freq: Once | INTRAMUSCULAR | Status: AC
  Administered 2021-12-25: 50 mg via INTRAVENOUS
  Filled 2021-12-25: qty 1

## 2021-12-25 MED ORDER — ACETAMINOPHEN 325 MG PO TABS
650.0000 mg | ORAL_TABLET | Freq: Four times a day (QID) | ORAL | Status: DC | PRN
  Administered 2021-12-26 – 2021-12-27 (×2): 650 mg via ORAL
  Filled 2021-12-25 (×2): qty 2

## 2021-12-25 NOTE — ED Triage Notes (Signed)
Pt woke up with swelling to neck this morning.  Denies any fevers.  Pt states she is able to swallow.

## 2021-12-25 NOTE — ED Notes (Signed)
Dr Pollyann Kennedy ENT paged to Dr Estell Harpin @ [phone]

## 2021-12-25 NOTE — H&P (Signed)
History and Physical    Patient: [name] NLG:[id number] DOB: April 10, 1981 DOA: 12/25/2021 DOS: the patient was seen and examined on 12/25/2021 PCP: Ameduite, Alvino Chapel, NP  Patient coming from: Home  Chief Complaint: No chief complaint on file.  HPI: [name] is a 41 y.o. female with medical history significant of hypertension who presents to the emergency department due to neck swelling which started this morning.  She states that she woke up this morning with neck swelling and as the day progresses, it appears as if she has got some swelling in her throat, she complained of swelling of glands around her jaw area and she complained of painful swallowing.  She denies fever, chills, chest pain, shortness of breath, headache, nausea or vomiting.   ED Course:  In the emergency department, BP was 126/102 and other vital signs were within normal range.  Work-up in the ED showed normal CBC and BMP except for hypokalemia.  Pregnancy test was negative.  Group A strep by PCR was not detected. CT neck with contrast showed: Pharyngitis and tonsillitis without evidence of tonsillar abscess or peritonsillar abscess. Pronounced inflammatory change in the parapharyngeal space in the retropharyngeal space. Fluid and or pus dissecting within the retropharyngeal space from C2-T1, maximal thickness 9 mm.  17 mm nodule in the posterior right lobe of the thyroid was noted.  She was empirically treated with Benadryl, epinephrine 0.3 mg, Solu-Medrol and IV hydration of NS 500 mL was provided.  ENT physician (Dr. Pollyann Kennedy) was consulted and states that he will consult on patient if admitted to [facility] per ED physician.  Hospitalist was asked to admit patient for further evaluation and management  Review of Systems: Review of systems as noted in the HPI. All other systems reviewed and are negative.   Past Medical History:  Diagnosis Date   Anemia    Diastolic dysfunction 09/28/2014   Grade 2. Mod LVH. EF  60%   GERD (gastroesophageal reflux disease)    Hypertension    Hypertensive urgency 09/26/2014   Left sided numbness 09/27/2014   TIA vs hypertensive neuropathy. MRI brain and C-spine unremarkable   Microcytic anemia 09/27/2014   Heavy menstrual periods   Preeclampsia     x3   Past Surgical History:  Procedure Laterality Date   c-sections     CESAREAN SECTION     x 3   DILITATION & CURRETTAGE/HYSTROSCOPY WITH NOVASURE ABLATION N/A 01/15/2018   Procedure: DILATATION & CURETTAGE/HYSTEROSCOPY WITH MINERVA ABLATION;  Surgeon: Lazaro Arms, MD;  Location: [facility];  Service: Gynecology;  Laterality: N/A;   TUBAL LIGATION     2012    Social History:  reports that she has been smoking cigarettes. She has a 2.50 pack-year smoking history. She has never used smokeless tobacco. She reports that she does not currently use alcohol. She reports that she does not use drugs.   No Known Allergies  Family History  Problem Relation Age of Onset   Hypertension Mother    Diabetes Mother    Anemia Mother    Hypertension Father    Diabetes Father    Heart attack Paternal Grandfather    Hypertension Paternal Grandmother    Diabetes Paternal Grandmother    Cancer Maternal Grandmother    Alcohol abuse Maternal Grandfather    Anemia Sister    Hypertension Sister      Prior to Admission medications   Medication Sig Start Date End Date Taking? Authorizing Provider  amLODipine (NORVASC) 10  MG tablet Take 1 tablet (10 mg total) by mouth daily. 12/06/21   Ameduite, Alvino Chapel, NP  lisinopril-hydrochlorothiazide (ZESTORETIC) 20-25 MG tablet Take 1 tablet by mouth daily. 12/06/21   Ameduite, Alvino Chapel, NP  ondansetron (ZOFRAN) 4 MG tablet Take 1 tablet (4 mg total) by mouth every 6 (six) hours. 02/10/20   Bethann Berkshire, MD  potassium chloride SA (KLOR-CON M) 20 MEQ tablet Take 1 tablet (20 mEq total) by mouth 2 (two) times daily. 12/06/21   Ameduite, Alvino Chapel, NP    Physical Exam: BP (!) 140/87   Pulse 70    Temp 99.1 F (37.3 C) (Oral)   Resp 18   Ht 5\' 5"  (1.651 m)   Wt 81.6 kg   SpO2 100%   BMI 29.95 kg/m   General: 41 y.o. year-old female well developed well nourished in no acute distress.  Alert and oriented x3. HEENT: NCAT, EOMI, inflammation of the tonsils, noted tender parotid and submandibular glands Neck: Supple, trachea medial Cardiovascular: Regular rate and rhythm with no rubs or gallops.  No thyromegaly or JVD noted.  No lower extremity edema. 2/4 pulses in all 4 extremities. Respiratory: Clear to auscultation with no wheezes or rales. Good inspiratory effort. Abdomen: Soft, nontender nondistended with normal bowel sounds x4 quadrants. Muskuloskeletal: No cyanosis, clubbing or edema noted bilaterally Neuro: CN II-XII intact, strength 5/5 x 4, sensation, reflexes intact Skin: No ulcerative lesions noted or rashes Psychiatry: Judgement and insight appear normal. Mood is appropriate for condition and setting          Labs on Admission:  Basic Metabolic Panel: Recent Labs  Lab 12/25/21 1706  NA 138  K 2.9*  CL 106  CO2 26  GLUCOSE 95  BUN 12  CREATININE 0.86  CALCIUM 8.9   Liver Function Tests: No results for input(s): "AST", "ALT", "ALKPHOS", "BILITOT", "PROT", "ALBUMIN" in the last 168 hours. No results for input(s): "LIPASE", "AMYLASE" in the last 168 hours. No results for input(s): "AMMONIA" in the last 168 hours. CBC: Recent Labs  Lab 12/25/21 1706  WBC 5.6  NEUTROABS 2.7  HGB 12.4  HCT 37.5  MCV 88.9  PLT 232   Cardiac Enzymes: No results for input(s): "CKTOTAL", "CKMB", "CKMBINDEX", "TROPONINI" in the last 168 hours.  BNP (last 3 results) No results for input(s): "BNP" in the last 8760 hours.  ProBNP (last 3 results) No results for input(s): "PROBNP" in the last 8760 hours.  CBG: No results for input(s): "GLUCAP" in the last 168 hours.  Radiological Exams on Admission: CT Soft Tissue Neck W Contrast  Result Date: 12/25/2021 CLINICAL DATA:   Soft tissue swelling, infection suspected. Symptoms began today. EXAM: CT NECK WITH CONTRAST TECHNIQUE: Multidetector CT imaging of the neck was performed using the standard protocol following the bolus administration of intravenous contrast. RADIATION DOSE REDUCTION: This exam was performed according to the departmental dose-optimization program which includes exposure control, adjustment of the mA and/or kV according to patient size and/or use of iterative reconstruction technique. CONTRAST:  37mL OMNIPAQUE IOHEXOL 300 MG/ML  SOLN COMPARISON:  None FINDINGS: Pharynx and larynx: There appears to be swelling of the pharyngeal tissues and tonsils. No tonsillar or peritonsillar abscess. There is pronounced inflammatory change in the parapharyngeal space and in the retropharyngeal space. Low-density material in the retropharyngeal space measures up to 9 mm in thickness and is consistent with pus or inflammatory edema. Salivary glands: Parotid and submandibular glands are normal. Thyroid: 17 mm nodule in the posterior right lobe.  Lymph nodes: No lymphadenopathy at this time. Vascular: Normal Limited intracranial: Normal Visualized orbits: Normal Mastoids and visualized paranasal sinuses: Clear Skeleton: Normal Upper chest: Normal Other: None IMPRESSION: Pharyngitis and tonsillitis without evidence of tonsillar abscess or peritonsillar abscess. Pronounced inflammatory change in the parapharyngeal space in the retropharyngeal space. Fluid and or pus dissecting within the retropharyngeal space from C2-T1, maximal thickness 9 mm. 17 mm nodule in the posterior right lobe of the thyroid. Recommend thyroid US (ref: J Am Coll Radiol. 2015 Feb;12(2): 143-50). Electronically Signed   By: Nelson Chimes M.D.   On: 12/25/2021 19:52    EKG: I independently viewed the EKG done and my findings are as followed: EKG was not done in the ED  Assessment/Plan Present on Admission:  Pharyngitis  Hypokalemia  Principal Problem:    Pharyngitis Active Problems:   Essential hypertension   Hypokalemia   Tonsillitis   Thyroid nodule  Tonsillitis and pharyngitis r/o angioedema Patient was started on IV ceftriaxone  Continue Augmentin Continue Tylenol prn Patient takes lisinopril at home and it was suspected that this could be Angioedema, Dr Rosen(ENT) was consulted and he will see patient if admitted to [facility]. Please notify him once pt. arrives to [facility].  Hypokalemia K+ 2.9, This was replenished  Incidental finding of thyroid nodule CT neck showed 17 mm nodule in the posterior right lobe of the thyroid was noted US thyroid recommended  Essential hypertension Continue Amlodipine Lisinopril is held at this time  DVT prophylaxis: Heparin subcu   Code Status: Full code   Consults: ENT (By [facility] ED Physician)  Family Communication: Husband at bedside (All questions answered to satisfaction)  Severity of Illness: The appropriate patient status for this patient is INPATIENT. Inpatient status is judged to be reasonable and necessary in order to provide the required intensity of service to ensure the patient's safety. The patient's presenting symptoms, physical exam findings, and initial radiographic and laboratory data in the context of their chronic comorbidities is felt to place them at high risk for further clinical deterioration. Furthermore, it is not anticipated that the patient will be medically stable for discharge from the hospital within 2 midnights of admission.   * I certify that at the point of admission it is my clinical judgment that the patient will require inpatient hospital care spanning beyond 2 midnights from the point of admission due to high intensity of service, high risk for further deterioration and high frequency of surveillance required.*  Author: Bernadette Hoit, DO 12/25/2021 11:46 PM  For on call review [email or web address].

## 2021-12-25 NOTE — ED Provider Notes (Signed)
[facility] EMERGENCY DEPARTMENT Provider Note   CSN: [id number] Arrival date & time: 12/25/21  1616     History {Add pertinent medical, surgical, social history, OB history to HPI:1} No chief complaint on file.   [name] is a 41 y.o. female.  Patient complains of discomfort in her throat and swelling in her neck   Sore Throat       Home Medications Prior to Admission medications   Medication Sig Start Date End Date Taking? Authorizing Provider  amLODipine (NORVASC) 10 MG tablet Take 1 tablet (10 mg total) by mouth daily. 12/06/21   Ameduite, Alvino Chapel, NP  lisinopril-hydrochlorothiazide (ZESTORETIC) 20-25 MG tablet Take 1 tablet by mouth daily. 12/06/21   Ameduite, Alvino Chapel, NP  ondansetron (ZOFRAN) 4 MG tablet Take 1 tablet (4 mg total) by mouth every 6 (six) hours. 02/10/20   Bethann Berkshire, MD  potassium chloride SA (KLOR-CON M) 20 MEQ tablet Take 1 tablet (20 mEq total) by mouth 2 (two) times daily. 12/06/21   Ameduite, Alvino Chapel, NP      Allergies    Patient has no known allergies.    Review of Systems   Review of Systems  Physical Exam Updated Vital Signs BP (!) 140/87   Pulse 70   Temp 99.1 F (37.3 C) (Oral)   Resp 18   Ht 5\' 5"  (1.651 m)   Wt 81.6 kg   SpO2 100%   BMI 29.95 kg/m  Physical Exam  ED Results / Procedures / Treatments   Labs (all labs ordered are listed, but only abnormal results are displayed) Labs Reviewed  CBC WITH DIFFERENTIAL/PLATELET - Abnormal; Notable for the following components:      Result Value   RDW 15.9 (*)    All other components within normal limits  BASIC METABOLIC PANEL - Abnormal; Notable for the following components:   Potassium 2.9 (*)    All other components within normal limits  GROUP A STREP BY PCR  HCG, QUANTITATIVE, PREGNANCY  PREGNANCY, URINE  MAGNESIUM  PHOSPHORUS    EKG None  Radiology CT Soft Tissue Neck W Contrast  Result Date: 12/25/2021 CLINICAL DATA:  Soft tissue swelling, infection  suspected. Symptoms began today. EXAM: CT NECK WITH CONTRAST TECHNIQUE: Multidetector CT imaging of the neck was performed using the standard protocol following the bolus administration of intravenous contrast. RADIATION DOSE REDUCTION: This exam was performed according to the departmental dose-optimization program which includes exposure control, adjustment of the mA and/or kV according to patient size and/or use of iterative reconstruction technique. CONTRAST:  110mL OMNIPAQUE IOHEXOL 300 MG/ML  SOLN COMPARISON:  None FINDINGS: Pharynx and larynx: There appears to be swelling of the pharyngeal tissues and tonsils. No tonsillar or peritonsillar abscess. There is pronounced inflammatory change in the parapharyngeal space and in the retropharyngeal space. Low-density material in the retropharyngeal space measures up to 9 mm in thickness and is consistent with pus or inflammatory edema. Salivary glands: Parotid and submandibular glands are normal. Thyroid: 17 mm nodule in the posterior right lobe. Lymph nodes: No lymphadenopathy at this time. Vascular: Normal Limited intracranial: Normal Visualized orbits: Normal Mastoids and visualized paranasal sinuses: Clear Skeleton: Normal Upper chest: Normal Other: None IMPRESSION: Pharyngitis and tonsillitis without evidence of tonsillar abscess or peritonsillar abscess. Pronounced inflammatory change in the parapharyngeal space in the retropharyngeal space. Fluid and or pus dissecting within the retropharyngeal space from C2-T1, maximal thickness 9 mm. 17 mm nodule in the posterior right lobe of the thyroid. Recommend  thyroid US (ref: J Am Coll Radiol. 2015 Feb;12(2): 143-50). Electronically Signed   By: Paulina Fusi M.D.   On: 12/25/2021 19:52    Procedures Procedures  {Document cardiac monitor, telemetry assessment procedure when appropriate:1}  Medications Ordered in ED Medications  cefTRIAXone (ROCEPHIN) 2 g in sodium chloride 0.9 % 100 mL IVPB (2 g Intravenous New  Bag/Given 12/25/21 2039)  methylPREDNISolone sodium succinate (SOLU-MEDROL) 125 mg/2 mL injection 125 mg (125 mg Intravenous Given 12/25/21 1722)  diphenhydrAMINE (BENADRYL) injection 50 mg (50 mg Intravenous Given 12/25/21 1721)  famotidine (PEPCID) IVPB 20 mg premix (0 mg Intravenous Stopped 12/25/21 1904)  EPINEPHrine (EPI-PEN) injection 0.3 mg (0.3 mg Intramuscular Given 12/25/21 1710)  sodium chloride 0.9 % bolus 500 mL (0 mLs Intravenous Stopped 12/25/21 1938)  iohexol (OMNIPAQUE) 300 MG/ML solution 75 mL (75 mLs Intravenous Contrast Given 12/25/21 1928)    ED Course/ Medical Decision Making/ A&P                           Medical Decision Making Amount and/or Complexity of Data Reviewed Labs: ordered. Radiology: ordered.  Risk Prescription drug management. Decision regarding hospitalization.   Patient with angioedema possible infection.  She admitted to medicine with ENT consult,  {Document critical care time when appropriate:1} {Document review of labs and clinical decision tools ie heart score, Chads2Vasc2 etc:1}  {Document your independent review of radiology images, and any outside records:1} {Document your discussion with family members, caretakers, and with consultants:1} {Document social determinants of health affecting pt's care:1} {Document your decision making why or why not admission, treatments were needed:1} Final Clinical Impression(s) / ED Diagnoses Final diagnoses:  Angioedema, initial encounter    Rx / DC Orders ED Discharge Orders     None

## 2021-12-25 NOTE — ED Notes (Signed)
Patient transported to CT 

## 2021-12-26 ENCOUNTER — Inpatient Hospital Stay (HOSPITAL_COMMUNITY): Payer: BC Managed Care – PPO

## 2021-12-26 DIAGNOSIS — J029 Acute pharyngitis, unspecified: Secondary | ICD-10-CM | POA: Diagnosis not present

## 2021-12-26 DIAGNOSIS — J028 Acute pharyngitis due to other specified organisms: Secondary | ICD-10-CM

## 2021-12-26 DIAGNOSIS — T783XXA Angioneurotic edema, initial encounter: Secondary | ICD-10-CM | POA: Diagnosis not present

## 2021-12-26 DIAGNOSIS — E876 Hypokalemia: Secondary | ICD-10-CM | POA: Diagnosis not present

## 2021-12-26 LAB — COMPREHENSIVE METABOLIC PANEL
ALT: 25 U/L (ref 0–44)
AST: 36 U/L (ref 15–41)
Albumin: 3.3 g/dL — ABNORMAL LOW (ref 3.5–5.0)
Alkaline Phosphatase: 80 U/L (ref 38–126)
Anion gap: 10 (ref 5–15)
BUN: 11 mg/dL (ref 6–20)
CO2: 20 mmol/L — ABNORMAL LOW (ref 22–32)
Calcium: 8.9 mg/dL (ref 8.9–10.3)
Chloride: 104 mmol/L (ref 98–111)
Creatinine, Ser: 0.76 mg/dL (ref 0.44–1.00)
GFR, Estimated: >60 mL/min (ref >60)
Glucose, Bld: 153 mg/dL — ABNORMAL HIGH (ref 70–99)
Potassium: 3.3 mmol/L — ABNORMAL LOW (ref 3.5–5.1)
Sodium: 134 mmol/L — ABNORMAL LOW (ref 135–145)
Total Bilirubin: 0.4 mg/dL (ref 0.3–1.2)
Total Protein: 8.3 g/dL — ABNORMAL HIGH (ref 6.5–8.1)

## 2021-12-26 LAB — PHOSPHORUS: Phosphorus: 2.1 mg/dL — ABNORMAL LOW (ref 2.5–4.6)

## 2021-12-26 LAB — CBC
HCT: 40.4 % (ref 36.0–46.0)
Hemoglobin: 13 g/dL (ref 12.0–15.0)
MCH: 28.6 pg (ref 26.0–34.0)
MCHC: 32.2 g/dL (ref 30.0–36.0)
MCV: 89 fL (ref 80.0–100.0)
Platelets: 235 10*3/uL (ref 150–400)
RBC: 4.54 MIL/uL (ref 3.87–5.11)
RDW: 15.8 % — ABNORMAL HIGH (ref 11.5–15.5)
WBC: 4.6 10*3/uL (ref 4.0–10.5)
nRBC: 0 % (ref 0.0–0.2)

## 2021-12-26 LAB — HIV ANTIBODY (ROUTINE TESTING W REFLEX): HIV Screen 4th Generation wRfx: NONREACTIVE

## 2021-12-26 LAB — RPR: RPR Ser Ql: NONREACTIVE

## 2021-12-26 LAB — SARS CORONAVIRUS 2 BY RT PCR: SARS Coronavirus 2 by RT PCR: NEGATIVE

## 2021-12-26 LAB — APTT: aPTT: 24 seconds (ref 24–36)

## 2021-12-26 LAB — MAGNESIUM: Magnesium: 1.7 mg/dL (ref 1.7–2.4)

## 2021-12-26 MED ORDER — AMLODIPINE BESYLATE 10 MG PO TABS
10.0000 mg | ORAL_TABLET | Freq: Every day | ORAL | Status: DC
  Administered 2021-12-26 – 2021-12-27 (×2): 10 mg via ORAL
  Filled 2021-12-26: qty 1
  Filled 2021-12-26: qty 2

## 2021-12-26 MED ORDER — SODIUM CHLORIDE 0.9 % IV SOLN
500.0000 mg | INTRAVENOUS | Status: DC
  Administered 2021-12-26 – 2021-12-27 (×2): 500 mg via INTRAVENOUS
  Filled 2021-12-26 (×2): qty 5

## 2021-12-26 MED ORDER — METHYLPREDNISOLONE SODIUM SUCC 125 MG IJ SOLR
60.0000 mg | Freq: Every day | INTRAMUSCULAR | Status: DC
  Administered 2021-12-26: 60 mg via INTRAVENOUS
  Filled 2021-12-26: qty 2

## 2021-12-26 MED ORDER — SODIUM CHLORIDE 0.9 % IV SOLN
3.0000 g | Freq: Four times a day (QID) | INTRAVENOUS | Status: DC
  Administered 2021-12-26 – 2021-12-27 (×5): 3 g via INTRAVENOUS
  Filled 2021-12-26 (×5): qty 8

## 2021-12-26 MED ORDER — DEXAMETHASONE SODIUM PHOSPHATE 10 MG/ML IJ SOLN
8.0000 mg | Freq: Three times a day (TID) | INTRAMUSCULAR | Status: DC
  Administered 2021-12-26 – 2021-12-27 (×2): 8 mg via INTRAVENOUS
  Filled 2021-12-26 (×2): qty 1

## 2021-12-26 NOTE — ED Notes (Signed)
[facility] called and notified of bed status at this time.

## 2021-12-26 NOTE — Progress Notes (Signed)
PROGRESS NOTE  [name] AVW:[id number] DOB: 12-03-1980 DOA: 12/25/2021 PCP: Deneise Lever, NP  Brief History:  41 year old female with a history of hypertension presenting with sore throat and neck swelling.  The patient noted some left facial swelling on the evening of 12/24/2021 before she went to bed.  She took some Benadryl.  She woke up in the early morning of 12/25/2021 with bilateral submandibular swelling and sore throat.  She did have some difficulty swallowing.  She has not had fevers, chills, chest pain, shortness of breath, coughing, hemoptysis.  She has not had any nausea, vomiting, diarrhea or abdominal pain.  She denies any recent sick contacts.  She has not had any other prodromal symptoms and had been in her usual state of health.  However, the patient states that she has been on lisinopril for last 10 years.  She denies any other new medications.  She denies any other rashes on her body or synovitis.  There is been no dysuria or hematuria or visual disturbance. In the ED, the patient had a low-grade temperature 99.1 F.  She was hemodynamically stable with oxygen saturation 100% room air.  BMP showed sodium of 138, potassium 2.9, bicarbonate 26, serum creatinine 0.86.  WBC 4.5, hemoglobin 13.0, platelets 235,000. CT of the neck showed pronounced inflammation in the parapharyngeal space and retropharyngeal space.  There was inflammatory changes dissecting within the retropharyngeal space C2-T1 with 9 mm thickening.  The patient was given Solu-Medrol, ceftriaxone in the ED. EDP contacted ENT, Dr. Pollyann Kennedy who will consult after transfer to [facility].     Assessment and Plan: * Pharyngitis Considerations include angioedema from lisinopril versus infectious cause -Throat swab for GC NAA -Patient had been given 2 g IV ceftriaxone already -Continue IV Unasyn -Check HIV -RPR -Viral respiratory panel -Check COVID -Empiric IV azithromycin to cover for mycoplasma -pt  does have poor dentition putting her at risk for future pharyngeal infections -continue IV solumedrol  Thyroid nodule Incidental finding on CT neck 17 mm right thyroid nodule Outpatient follow-up  Tonsillitis Work-up as above No signs of Ludwig's angina presently No respiratory distress or hypersalivation  Hypokalemia Replete Check mag  Essential hypertension Continue amlodipine D/c lisinopril     Family Communication:   no Family at bedside  Consultants:  ENT--[facility]  Code Status:  FULL   DVT Prophylaxis:  SCDs   Procedures: As Listed in Progress Note Above  Antibiotics: Unasyn 8/1>>    Subjective: Patient still complains of "throat swelling" and "sore throat".  Denies hemoptysis, sob, hypersalivation.  No f/c, cp, n/v/d, cp, sob.  Objective: Vitals:   12/25/21 1805 12/25/21 2206 12/26/21 0229 12/26/21 0645  BP: (!) 140/87  139/87 (!) 167/92  Pulse: 70  87 65  Resp: 18  19 18   Temp:      TempSrc:      SpO2: 100% 100% 100% 100%  Weight:      Height:        Intake/Output Summary (Last 24 hours) at 12/26/2021 0720 Last data filed at 12/25/2021 2112 Gross per 24 hour  Intake 648.59 ml  Output --  Net 648.59 ml   Weight change:  Exam:  General:  Pt is alert, follows commands appropriately, not in acute distress HEENT: submandibular swelling with hypertrophied glands;  minimal tenderness;  no ulcers or exudates noted in posterior pharynx Cardiovascular: RRR, S1/S2, no rubs, no gallops Respiratory: CTA bilaterally, no wheezing, no crackles, no  rhonchi Abdomen: Soft/+BS, non tender, non distended, no guarding Extremities: No edema, No lymphangitis, No petechiae, No rashes, no synovitis   Data Reviewed: I have personally reviewed following labs and imaging studies Basic Metabolic Panel: Recent Labs  Lab 12/25/21 1706 12/26/21 0420  NA 138 134*  K 2.9* 3.3*  CL 106 104  CO2 26 20*  GLUCOSE 95 153*  BUN 12 11  CREATININE 0.86 0.76  CALCIUM 8.9  8.9  MG  --  1.7  PHOS  --  2.1*   Liver Function Tests: Recent Labs  Lab 12/26/21 0420  AST 36  ALT 25  ALKPHOS 80  BILITOT 0.4  PROT 8.3*  ALBUMIN 3.3*   No results for input(s): "LIPASE", "AMYLASE" in the last 168 hours. No results for input(s): "AMMONIA" in the last 168 hours. Coagulation Profile: No results for input(s): "INR", "PROTIME" in the last 168 hours. CBC: Recent Labs  Lab 12/25/21 1706 12/26/21 0420  WBC 5.6 4.6  NEUTROABS 2.7  --   HGB 12.4 13.0  HCT 37.5 40.4  MCV 88.9 89.0  PLT 232 235   Cardiac Enzymes: No results for input(s): "CKTOTAL", "CKMB", "CKMBINDEX", "TROPONINI" in the last 168 hours. BNP: Invalid input(s): "POCBNP" CBG: No results for input(s): "GLUCAP" in the last 168 hours. HbA1C: No results for input(s): "HGBA1C" in the last 72 hours. Urine analysis:    Component Value Date/Time   COLORURINE YELLOW 11/06/2018 2124   APPEARANCEUR HAZY (A) 11/06/2018 2124   LABSPEC 1.013 11/06/2018 2124   PHURINE 6.0 11/06/2018 2124   GLUCOSEU NEGATIVE 11/06/2018 2124   HGBUR MODERATE (A) 11/06/2018 2124   BILIRUBINUR NEGATIVE 11/06/2018 2124   KETONESUR NEGATIVE 11/06/2018 2124   PROTEINUR NEGATIVE 11/06/2018 2124   UROBILINOGEN 0.2 03/12/2008 1235   NITRITE NEGATIVE 11/06/2018 2124   LEUKOCYTESUR NEGATIVE 11/06/2018 2124   Sepsis Labs: @LABRCNTIP (procalcitonin:4,lacticidven:4) ) Recent Results (from the past 240 hour(s))  Group A Strep by PCR     Status: None   Collection Time: 12/25/21  8:32 PM   Specimen: Throat; Sterile Swab  Result Value Ref Range Status   Group A Strep by PCR NOT DETECTED NOT DETECTED Final    Comment: Performed at [facility], [address] [place], [place]     Scheduled Meds:  amLODipine  10 mg Oral Daily   amoxicillin-clavulanate  1 tablet Oral BID   heparin injection (subcutaneous)  5,000 Units Subcutaneous Q8H   Continuous Infusions:  Procedures/Studies: CT Soft Tissue Neck W  Contrast  Result Date: 12/25/2021 CLINICAL DATA:  Soft tissue swelling, infection suspected. Symptoms began today. EXAM: CT NECK WITH CONTRAST TECHNIQUE: Multidetector CT imaging of the neck was performed using the standard protocol following the bolus administration of intravenous contrast. RADIATION DOSE REDUCTION: This exam was performed according to the departmental dose-optimization program which includes exposure control, adjustment of the mA and/or kV according to patient size and/or use of iterative reconstruction technique. CONTRAST:  73mL OMNIPAQUE IOHEXOL 300 MG/ML  SOLN COMPARISON:  None FINDINGS: Pharynx and larynx: There appears to be swelling of the pharyngeal tissues and tonsils. No tonsillar or peritonsillar abscess. There is pronounced inflammatory change in the parapharyngeal space and in the retropharyngeal space. Low-density material in the retropharyngeal space measures up to 9 mm in thickness and is consistent with pus or inflammatory edema. Salivary glands: Parotid and submandibular glands are normal. Thyroid: 17 mm nodule in the posterior right lobe. Lymph nodes: No lymphadenopathy at this time. Vascular: Normal Limited intracranial: Normal Visualized  orbits: Normal Mastoids and visualized paranasal sinuses: Clear Skeleton: Normal Upper chest: Normal Other: None IMPRESSION: Pharyngitis and tonsillitis without evidence of tonsillar abscess or peritonsillar abscess. Pronounced inflammatory change in the parapharyngeal space in the retropharyngeal space. Fluid and or pus dissecting within the retropharyngeal space from C2-T1, maximal thickness 9 mm. 17 mm nodule in the posterior right lobe of the thyroid. Recommend thyroid US (ref: J Am Coll Radiol. 2015 Feb;12(2): 143-50). Electronically Signed   By: Nelson Chimes M.D.   On: 12/25/2021 19:52    Orson Eva, DO  [facility]  If 7PM-7AM, please contact night-coverage [email or web address] Password [email or web address] 12/26/2021, 7:20 AM   LOS: 1 day

## 2021-12-26 NOTE — Assessment & Plan Note (Signed)
Work-up as above No signs of Ludwig's angina presently No respiratory distress or hypersalivation

## 2021-12-26 NOTE — Hospital Course (Addendum)
41 year old female with a history of hypertension presenting with sore throat and neck swelling.  The patient noted some left facial swelling on the evening of 12/24/2021 before she went to bed.  She took some Benadryl.  She woke up in the early morning of 12/25/2021 with bilateral submandibular swelling and sore throat.  She did have some difficulty swallowing.  She has not had fevers, chills, chest pain, shortness of breath, coughing, hemoptysis.  She has not had any nausea, vomiting, diarrhea or abdominal pain.  She denies any recent sick contacts.  She has not had any other prodromal symptoms and had been in her usual state of health.  However, the patient states that she has been on lisinopril for last 10 years.  She denies any other new medications.  She denies any other rashes on her body or synovitis.  There is been no dysuria or hematuria or visual disturbance. In the ED, the patient had a low-grade temperature 99.1 F.  She was hemodynamically stable with oxygen saturation 100% room air.  BMP showed sodium of 138, potassium 2.9, bicarbonate 26, serum creatinine 0.86.  WBC 4.5, hemoglobin 13.0, platelets 235,000. CT of the neck showed pronounced inflammation in the parapharyngeal space and retropharyngeal space.  There was inflammatory changes dissecting within the retropharyngeal space C2-T1 with 9 mm thickening.  The patient was given Solu-Medrol, ceftriaxone in the ED. EDP contacted ENT, Dr. Pollyann Kennedy who will consult after transfer to [facility].

## 2021-12-26 NOTE — Assessment & Plan Note (Addendum)
Considerations include angioedema from lisinopril versus infectious cause -Throat swab for GC NAA -Patient had been given 2 g IV ceftriaxone already -Continue IV Unasyn -Check HIV -RPR -Viral respiratory panel -Check COVID -Empiric IV azithromycin to cover for mycoplasma -pt does have poor dentition putting her at risk for future pharyngeal infections -continue IV solumedrol

## 2021-12-26 NOTE — Progress Notes (Addendum)
PROGRESS NOTE    [name]  WCH:[id number] DOB: 12/21/1980 DOA: 12/25/2021 PCP: Deneise Lever, NP   Brief Narrative:  41 year old female with a history of hypertension presenting at [facility] with sore throat and neck swelling.  Initial concern for angioedema versus infectious causes given questionable fluid versus past dissecting retropharyngeal space from C2-T1 and profound edema with marked dysphagia.  ENT was consulted at that time and recommended transfer to [facility] for further evaluation and treatment.  Dr. Pollyann Kennedy has been notified of patient's arrival.  **Update - ENT evaluated - recommending dexamethasone 8mg  q8h x3 doses and continue current abx regimen. Requests we reconsult ENT in 24h if symptoms are not improving.  Assessment & Plan:   Principal Problem:   Pharyngitis Active Problems:   Essential hypertension   Hypokalemia   Tonsillitis   Thyroid nodule   Consultants:  ENT  Procedures:  Pending consult above  Antimicrobials:  Ampicillin   Objective: Vitals:   12/26/21 0915 12/26/21 0930 12/26/21 1328 12/26/21 1330  BP:  (!) 176/101  (!) 164/98  Pulse: 80 69  69  Resp:    18  Temp:   98.5 F (36.9 C)   TempSrc:   Oral   SpO2: 99% 100%  100%  Weight:      Height:        Intake/Output Summary (Last 24 hours) at 12/26/2021 1601 Last data filed at 12/26/2021 1010 Gross per 24 hour  Intake 999.36 ml  Output --  Net 999.36 ml   Filed Weights   12/25/21 1622  Weight: 81.6 kg   Data Reviewed: I have personally reviewed following labs and imaging studies  CBC: Recent Labs  Lab 12/25/21 1706 12/26/21 0420  WBC 5.6 4.6  NEUTROABS 2.7  --   HGB 12.4 13.0  HCT 37.5 40.4  MCV 88.9 89.0  PLT 232 235   Basic Metabolic Panel: Recent Labs  Lab 12/25/21 1706 12/26/21 0420  NA 138 134*  K 2.9* 3.3*  CL 106 104  CO2 26 20*  GLUCOSE 95 153*  BUN 12 11  CREATININE 0.86 0.76  CALCIUM 8.9 8.9  MG  --  1.7  PHOS  --  2.1*    GFR: Estimated Creatinine Clearance: 98.6 mL/min (by C-G formula based on SCr of 0.76 mg/dL). Liver Function Tests: Recent Labs  Lab 12/26/21 0420  AST 36  ALT 25  ALKPHOS 80  BILITOT 0.4  PROT 8.3*  ALBUMIN 3.3*   No results for input(s): "LIPASE", "AMYLASE" in the last 168 hours. No results for input(s): "AMMONIA" in the last 168 hours. Coagulation Profile: No results for input(s): "INR", "PROTIME" in the last 168 hours. Cardiac Enzymes: No results for input(s): "CKTOTAL", "CKMB", "CKMBINDEX", "TROPONINI" in the last 168 hours. BNP (last 3 results) No results for input(s): "PROBNP" in the last 8760 hours. HbA1C: No results for input(s): "HGBA1C" in the last 72 hours. CBG: No results for input(s): "GLUCAP" in the last 168 hours. Lipid Profile: No results for input(s): "CHOL", "HDL", "LDLCALC", "TRIG", "CHOLHDL", "LDLDIRECT" in the last 72 hours. Thyroid Function Tests: No results for input(s): "TSH", "T4TOTAL", "FREET4", "T3FREE", "THYROIDAB" in the last 72 hours. Anemia Panel: No results for input(s): "VITAMINB12", "FOLATE", "FERRITIN", "TIBC", "IRON", "RETICCTPCT" in the last 72 hours. Sepsis Labs: No results for input(s): "PROCALCITON", "LATICACIDVEN" in the last 168 hours.  Recent Results (from the past 240 hour(s))  Group A Strep by PCR     Status: None   Collection  Time: 12/25/21  8:32 PM   Specimen: Throat; Sterile Swab  Result Value Ref Range Status   Group A Strep by PCR NOT DETECTED NOT DETECTED Final    Comment: Performed at [facility], [address], [place], [place] [address]  SARS Coronavirus 2 by RT PCR (hospital order, performed in [facility] hospital lab) *cepheid single result test* Throat     Status: None   Collection Time: 12/26/21  8:12 AM   Specimen: Throat; Nasal Swab  Result Value Ref Range Status   SARS Coronavirus 2 by RT PCR NEGATIVE NEGATIVE Final    Comment: (NOTE) SARS-CoV-2 target nucleic acids are NOT DETECTED.  The SARS-CoV-2  RNA is generally detectable in upper and lower respiratory specimens during the acute phase of infection. The lowest concentration of SARS-CoV-2 viral copies this assay can detect is 250 copies / mL. A negative result does not preclude SARS-CoV-2 infection and should not be used as the sole basis for treatment or other patient management decisions.  A negative result may occur with improper specimen collection / handling, submission of specimen other than nasopharyngeal swab, presence of viral mutation(s) within the areas targeted by this assay, and inadequate number of viral copies (<250 copies / mL). A negative result must be combined with clinical observations, patient history, and epidemiological information.  Fact Sheet for Patients:   RoadLapTop.co.za  Fact Sheet for Healthcare Providers: http://kim-miller.com/  This test is not yet approved or  cleared by the [place] FDA and has been authorized for detection and/or diagnosis of SARS-CoV-2 by FDA under an Emergency Use Authorization (EUA).  This EUA will remain in effect (meaning this test can be used) for the duration of the COVID-19 declaration under Section 564(b)(1) of the Act, 21 U.S.C. section 360bbb-3(b)(1), unless the authorization is terminated or revoked sooner.  Performed at [facility], [address], [place], [place] [address]      Radiology Studies: US THYROID  Result Date: 12/26/2021 CLINICAL DATA:  41 year old female with a history of thyroid nodule EXAM: THYROID ULTRASOUND TECHNIQUE: Ultrasound examination of the thyroid gland and adjacent soft tissues was performed. COMPARISON:  None FINDINGS: Parenchymal Echotexture: Mildly heterogenous Isthmus: 0.2 cm Right lobe: 5.2 cm x 2.4 cm x 1.5 cm Left lobe: 4.8 cm x 1.8 cm x 1.8 cm _________________________________________________________ Estimated total number of nodules >/= 1 cm: 1 Number of spongiform nodules >/=   2 cm not described below (TR1): 0 Number of mixed cystic and solid nodules >/= 1.5 cm not described below (TR2): 0 _________________________________________________________ Nodule # 1: Location: Right; Inferior Maximum size: 1.4 cm; Other 2 dimensions: 1.0 cm x 1.0 cm Composition: solid/almost completely solid (2) Echogenicity: isoechoic (1) Shape: taller-than-wide (3) Margins: ill-defined (0) Echogenic foci: none (0) ACR TI-RADS total points: 6. ACR TI-RADS risk category: TR4 (4-6 points). ACR TI-RADS recommendations: Nodule meets criteria for surveillance _________________________________________________________ No adenopathy IMPRESSION: Right inferior thyroid nodule meets criteria for surveillance, as designated by the newly established ACR TI-RADS criteria. Surveillance ultrasound study recommended to be performed annually up to 5 years. Recommendations follow those established by the new ACR TI-RADS criteria (J Am Coll Radiol 2017;14:587-595). Electronically Signed   By: Gilmer Mor D.O.   On: 12/26/2021 09:35   CT Soft Tissue Neck W Contrast  Result Date: 12/25/2021 CLINICAL DATA:  Soft tissue swelling, infection suspected. Symptoms began today. EXAM: CT NECK WITH CONTRAST TECHNIQUE: Multidetector CT imaging of the neck was performed using the standard protocol following the bolus administration of  intravenous contrast. RADIATION DOSE REDUCTION: This exam was performed according to the departmental dose-optimization program which includes exposure control, adjustment of the mA and/or kV according to patient size and/or use of iterative reconstruction technique. CONTRAST:  26mL OMNIPAQUE IOHEXOL 300 MG/ML  SOLN COMPARISON:  None FINDINGS: Pharynx and larynx: There appears to be swelling of the pharyngeal tissues and tonsils. No tonsillar or peritonsillar abscess. There is pronounced inflammatory change in the parapharyngeal space and in the retropharyngeal space. Low-density material in the  retropharyngeal space measures up to 9 mm in thickness and is consistent with pus or inflammatory edema. Salivary glands: Parotid and submandibular glands are normal. Thyroid: 17 mm nodule in the posterior right lobe. Lymph nodes: No lymphadenopathy at this time. Vascular: Normal Limited intracranial: Normal Visualized orbits: Normal Mastoids and visualized paranasal sinuses: Clear Skeleton: Normal Upper chest: Normal Other: None IMPRESSION: Pharyngitis and tonsillitis without evidence of tonsillar abscess or peritonsillar abscess. Pronounced inflammatory change in the parapharyngeal space in the retropharyngeal space. Fluid and or pus dissecting within the retropharyngeal space from C2-T1, maximal thickness 9 mm. 17 mm nodule in the posterior right lobe of the thyroid. Recommend thyroid US (ref: J Am Coll Radiol. 2015 Feb;12(2): 143-50). Electronically Signed   By: Paulina Fusi M.D.   On: 12/25/2021 19:52    Scheduled Meds:  amLODipine  10 mg Oral Daily   heparin injection (subcutaneous)  5,000 Units Subcutaneous Q8H   methylPREDNISolone (SOLU-MEDROL) injection  60 mg Intravenous Daily   Continuous Infusions:  ampicillin-sulbactam (UNASYN) IV 3 g (12/26/21 1328)   azithromycin Stopped (12/26/21 0904)     LOS: 1 day   Time spent:  Azucena Fallen, DO [facility]  If 7PM-7AM, please contact night-coverage [email or web address]  12/26/2021, 4:01 PM

## 2021-12-26 NOTE — Assessment & Plan Note (Signed)
Continue amlodipine D/c lisinopril

## 2021-12-26 NOTE — Progress Notes (Signed)
  Transition of Care [facility]) Screening Note   Patient Details  Name: NASIRAH SACHS Date of Birth: 1980/11/23   Transition of Care [facility]) CM/SW Contact:    Annice Needy, LCSW Phone Number: 12/26/2021, 10:11 AM    Transition of Care Department [facility]) has reviewed patient and no TOC needs have been identified at this time. We will continue to monitor patient advancement through interdisciplinary progression rounds. If new patient transition needs arise, please place a TOC consult.

## 2021-12-26 NOTE — Assessment & Plan Note (Signed)
Replete Check mag 

## 2021-12-26 NOTE — Assessment & Plan Note (Signed)
Incidental finding on CT neck 17 mm right thyroid nodule Outpatient follow-up

## 2021-12-27 DIAGNOSIS — J028 Acute pharyngitis due to other specified organisms: Secondary | ICD-10-CM | POA: Diagnosis not present

## 2021-12-27 LAB — CBC
HCT: 37 % (ref 36.0–46.0)
Hemoglobin: 12.3 g/dL (ref 12.0–15.0)
MCH: 29.4 pg (ref 26.0–34.0)
MCHC: 33.2 g/dL (ref 30.0–36.0)
MCV: 88.3 fL (ref 80.0–100.0)
Platelets: 236 10*3/uL (ref 150–400)
RBC: 4.19 MIL/uL (ref 3.87–5.11)
RDW: 15.9 % — ABNORMAL HIGH (ref 11.5–15.5)
WBC: 8 10*3/uL (ref 4.0–10.5)
nRBC: 0 % (ref 0.0–0.2)

## 2021-12-27 LAB — BASIC METABOLIC PANEL
Anion gap: 7 (ref 5–15)
BUN: 12 mg/dL (ref 6–20)
CO2: 24 mmol/L (ref 22–32)
Calcium: 9.1 mg/dL (ref 8.9–10.3)
Chloride: 104 mmol/L (ref 98–111)
Creatinine, Ser: 0.71 mg/dL (ref 0.44–1.00)
GFR, Estimated: >60 mL/min (ref >60)
Glucose, Bld: 114 mg/dL — ABNORMAL HIGH (ref 70–99)
Potassium: 3.4 mmol/L — ABNORMAL LOW (ref 3.5–5.1)
Sodium: 135 mmol/L (ref 135–145)

## 2021-12-27 LAB — GC/CHLAMYDIA PROBE AMP (~~LOC~~) NOT AT ARMC
Chlamydia: NEGATIVE
Comment: NEGATIVE
Comment: NORMAL
Neisseria Gonorrhea: NEGATIVE

## 2021-12-27 MED ORDER — AMOXICILLIN-POT CLAVULANATE 875-125 MG PO TABS: 1.0000 | ORAL_TABLET | Freq: Two times a day (BID) | ORAL | 0 refills | Status: AC

## 2021-12-27 MED ORDER — DEXAMETHASONE 4 MG PO TABS: ORAL_TABLET | ORAL | 0 refills | Status: AC

## 2021-12-27 NOTE — Consult Note (Signed)
Reason for Consult: Pharyngitis Referring Physician: Dorcas Carrow, MD  [name] is an 41 y.o. female.  HPI: About 3 days ago she developed sudden onset of left facial swelling.  It spread into the submandibular region bilaterally.  She was having swelling sensation in her throat some trouble swallowing and globus sensation.  She was evaluated at the [facility] emergency department.  I was consulted by phone and recommended transfer to [facility] hospital to the medical service.  She arrived late yesterday.  She is feeling much better today.  She is on an ACE inhibitor.  Past Medical History:  Diagnosis Date   Anemia    Diastolic dysfunction 09/28/2014   Grade 2. Mod LVH. EF 60%   GERD (gastroesophageal reflux disease)    Hypertension    Hypertensive urgency 09/26/2014   Left sided numbness 09/27/2014   TIA vs hypertensive neuropathy. MRI brain and C-spine unremarkable   Microcytic anemia 09/27/2014   Heavy menstrual periods   Preeclampsia     x3    Past Surgical History:  Procedure Laterality Date   c-sections     CESAREAN SECTION     x 3   DILITATION & CURRETTAGE/HYSTROSCOPY WITH NOVASURE ABLATION N/A 01/15/2018   Procedure: DILATATION & CURETTAGE/HYSTEROSCOPY WITH MINERVA ABLATION;  Surgeon: Lazaro Arms, MD;  Location: [facility];  Service: Gynecology;  Laterality: N/A;   TUBAL LIGATION     2012    Family History  Problem Relation Age of Onset   Hypertension Mother    Diabetes Mother    Anemia Mother    Hypertension Father    Diabetes Father    Heart attack Paternal Grandfather    Hypertension Paternal Grandmother    Diabetes Paternal Grandmother    Cancer Maternal Grandmother    Alcohol abuse Maternal Grandfather    Anemia Sister    Hypertension Sister     Social History:  reports that she has been smoking cigarettes. She has a 2.50 pack-year smoking history. She has never used smokeless tobacco. She reports that she does not currently use alcohol. She reports that she  does not use drugs.  Allergies: No Known Allergies  Medications: Reviewed  Results for orders placed or performed during the hospital encounter of 12/25/21 (from the past 48 hour(s))  CBC with Differential     Status: Abnormal   Collection Time: 12/25/21  5:06 PM  Result Value Ref Range   WBC 5.6 4.0 - 10.5 K/uL   RBC 4.22 3.87 - 5.11 MIL/uL   Hemoglobin 12.4 12.0 - 15.0 g/dL   HCT 73.5 32.9 - 92.4 %   MCV 88.9 80.0 - 100.0 fL   MCH 29.4 26.0 - 34.0 pg   MCHC 33.1 30.0 - 36.0 g/dL   RDW 26.8 (H) 34.1 - 96.2 %   Platelets 232 150 - 400 K/uL   nRBC 0.0 0.0 - 0.2 %   Neutrophils Relative % 46 %   Neutro Abs 2.7 1.7 - 7.7 K/uL   Lymphocytes Relative 44 %   Lymphs Abs 2.5 0.7 - 4.0 K/uL   Monocytes Relative 6 %   Monocytes Absolute 0.3 0.1 - 1.0 K/uL   Eosinophils Relative 3 %   Eosinophils Absolute 0.2 0.0 - 0.5 K/uL   Basophils Relative 1 %   Basophils Absolute 0.0 0.0 - 0.1 K/uL   Immature Granulocytes 0 %   Abs Immature Granulocytes 0.01 0.00 - 0.07 K/uL    Comment: Performed at [facility] [address],  [place], [place] [id number]  Basic metabolic panel     Status: Abnormal   Collection Time: 12/25/21  5:06 PM  Result Value Ref Range   Sodium 138 135 - 145 mmol/L   Potassium 2.9 (L) 3.5 - 5.1 mmol/L   Chloride 106 98 - 111 mmol/L   CO2 26 22 - 32 mmol/L   Glucose, Bld 95 70 - 99 mg/dL    Comment: Glucose reference range applies only to samples taken after fasting for at least 8 hours.   BUN 12 6 - 20 mg/dL   Creatinine, Ser 0.86 0.44 - 1.00 mg/dL   Calcium 8.9 8.9 - 10.3 mg/dL   GFR, Estimated >60 >60 mL/min    Comment: (NOTE) Calculated using the CKD-EPI Creatinine Equation (2021)    Anion gap 6 5 - 15    Comment: Performed at [facility], [address], [place], [place] [address]  hCG, quantitative, pregnancy     Status: None   Collection Time: 12/25/21  5:06 PM  Result Value Ref Range   hCG, Beta Chain, Quant, S <1 <5 mIU/mL    Comment:          GEST. AGE       CONC.  (mIU/mL)   <=1 WEEK        5 - 50     2 WEEKS       50 - 500     3 WEEKS       100 - 10,000     4 WEEKS     1,000 - 30,000     5 WEEKS     3,500 - 115,000   6-8 WEEKS     12,000 - 270,000    12 WEEKS     15,000 - 220,000        FEMALE AND NON-PREGNANT FEMALE:     LESS THAN 5 mIU/mL Performed at [facility], [address], [place], [place] [address]   HIV Antibody (routine testing w rflx)     Status: None   Collection Time: 12/25/21  5:08 PM  Result Value Ref Range   HIV Screen 4th Generation wRfx Non Reactive Non Reactive    Comment: Performed at [facility], [place] [address], [place], [place] [address]  Group A Strep by PCR     Status: None   Collection Time: 12/25/21  8:32 PM   Specimen: Throat; Sterile Swab  Result Value Ref Range   Group A Strep by PCR NOT DETECTED NOT DETECTED    Comment: Performed at [facility], [address] [place], [place] [address]  Magnesium     Status: None   Collection Time: 12/26/21  4:20 AM  Result Value Ref Range   Magnesium 1.7 1.7 - 2.4 mg/dL    Comment: Performed at [facility], [address], [place], [place] [address]  Phosphorus     Status: Abnormal   Collection Time: 12/26/21  4:20 AM  Result Value Ref Range   Phosphorus 2.1 (L) 2.5 - 4.6 mg/dL    Comment: Performed at [facility], [address], [place], [place] [address]  Comprehensive metabolic panel     Status: Abnormal   Collection Time: 12/26/21  4:20 AM  Result Value Ref Range   Sodium 134 (L) 135 - 145 mmol/L   Potassium 3.3 (L) 3.5 - 5.1 mmol/L   Chloride 104 98 - 111 mmol/L   CO2 20 (L) 22 - 32 mmol/L   Glucose, Bld 153 (H)  70 - 99 mg/dL    Comment: Glucose reference range applies only to samples taken after fasting for at least 8 hours.   BUN 11 6 - 20 mg/dL   Creatinine, Ser 0.76 0.44 - 1.00 mg/dL   Calcium 8.9 8.9 - 10.3 mg/dL   Total Protein 8.3 (H) 6.5 - 8.1 g/dL   Albumin 3.3 (L) 3.5 - 5.0 g/dL   AST 36 15 - 41 U/L   ALT 25 0 - 44 U/L    Alkaline Phosphatase 80 38 - 126 U/L   Total Bilirubin 0.4 0.3 - 1.2 mg/dL   GFR, Estimated >60 >60 mL/min    Comment: (NOTE) Calculated using the CKD-EPI Creatinine Equation (2021)    Anion gap 10 5 - 15    Comment: Performed at [facility], [address], [place], [place] [address]  CBC     Status: Abnormal   Collection Time: 12/26/21  4:20 AM  Result Value Ref Range   WBC 4.6 4.0 - 10.5 K/uL   RBC 4.54 3.87 - 5.11 MIL/uL   Hemoglobin 13.0 12.0 - 15.0 g/dL   HCT 40.4 36.0 - 46.0 %   MCV 89.0 80.0 - 100.0 fL   MCH 28.6 26.0 - 34.0 pg   MCHC 32.2 30.0 - 36.0 g/dL   RDW 15.8 (H) 11.5 - 15.5 %   Platelets 235 150 - 400 K/uL   nRBC 0.0 0.0 - 0.2 %    Comment: Performed at [facility], [address], [place], [place] [address]  APTT     Status: None   Collection Time: 12/26/21  4:20 AM  Result Value Ref Range   aPTT 24 24 - 36 seconds    Comment: Performed at [facility], [address] [place], [place] [address]  RPR     Status: None   Collection Time: 12/26/21  4:20 AM  Result Value Ref Range   RPR Ser Ql NON REACTIVE NON REACTIVE    Comment: Performed at [facility] [address] [place], [place] [address]  SARS Coronavirus 2 by RT PCR (hospital order, performed in [facility] hospital lab) *cepheid single result test* Throat     Status: None   Collection Time: 12/26/21  8:12 AM   Specimen: Throat; Nasal Swab  Result Value Ref Range   SARS Coronavirus 2 by RT PCR NEGATIVE NEGATIVE    Comment: (NOTE) SARS-CoV-2 target nucleic acids are NOT DETECTED.  The SARS-CoV-2 RNA is generally detectable in upper and lower respiratory specimens during the acute phase of infection. The lowest concentration of SARS-CoV-2 viral copies this assay can detect is 250 copies / mL. A negative result does not preclude SARS-CoV-2 infection and should not be used as the sole basis for treatment or other patient management decisions.  A negative result may occur with improper specimen  collection / handling, submission of specimen other than nasopharyngeal swab, presence of viral mutation(s) within the areas targeted by this assay, and inadequate number of viral copies (<250 copies / mL). A negative result must be combined with clinical observations, patient history, and epidemiological information.  Fact Sheet for Patients:   https://www.patel.info/  Fact Sheet for Healthcare Providers: https://hall.com/  This test is not yet approved or  cleared by the [place] FDA and has been authorized for detection and/or diagnosis of SARS-CoV-2 by FDA under an Emergency Use Authorization (EUA).  This EUA will remain in effect (meaning this test can be used) for the duration of the  COVID-19 declaration under Section 564(b)(1) of the Act, 21 U.S.C. section 360bbb-3(b)(1), unless the authorization is terminated or revoked sooner.  Performed at [facility], [address] [place], [place] [address]   CBC     Status: Abnormal   Collection Time: 12/27/21  4:33 AM  Result Value Ref Range   WBC 8.0 4.0 - 10.5 K/uL   RBC 4.19 3.87 - 5.11 MIL/uL   Hemoglobin 12.3 12.0 - 15.0 g/dL   HCT 37.0 36.0 - 46.0 %   MCV 88.3 80.0 - 100.0 fL   MCH 29.4 26.0 - 34.0 pg   MCHC 33.2 30.0 - 36.0 g/dL   RDW 15.9 (H) 11.5 - 15.5 %   Platelets 236 150 - 400 K/uL   nRBC 0.0 0.0 - 0.2 %    Comment: Performed at [facility], [place] [address] [place], [place] [id number]  Basic metabolic panel     Status: Abnormal   Collection Time: 12/27/21  4:33 AM  Result Value Ref Range   Sodium 135 135 - 145 mmol/L   Potassium 3.4 (L) 3.5 - 5.1 mmol/L   Chloride 104 98 - 111 mmol/L   CO2 24 22 - 32 mmol/L   Glucose, Bld 114 (H) 70 - 99 mg/dL    Comment: Glucose reference range applies only to samples taken after fasting for at least 8 hours.   BUN 12 6 - 20 mg/dL   Creatinine, Ser 0.71 0.44 - 1.00 mg/dL   Calcium 9.1 8.9 - 10.3 mg/dL   GFR, Estimated  >60 >60 mL/min    Comment: (NOTE) Calculated using the CKD-EPI Creatinine Equation (2021)    Anion gap 7 5 - 15    Comment: Performed at [facility] [address] [place], [place] [address]    US THYROID  Result Date: 12/26/2021 CLINICAL DATA:  41 year old female with a history of thyroid nodule EXAM: THYROID ULTRASOUND TECHNIQUE: Ultrasound examination of the thyroid gland and adjacent soft tissues was performed. COMPARISON:  None FINDINGS: Parenchymal Echotexture: Mildly heterogenous Isthmus: 0.2 cm Right lobe: 5.2 cm x 2.4 cm x 1.5 cm Left lobe: 4.8 cm x 1.8 cm x 1.8 cm _________________________________________________________ Estimated total number of nodules >/= 1 cm: 1 Number of spongiform nodules >/=  2 cm not described below (TR1): 0 Number of mixed cystic and solid nodules >/= 1.5 cm not described below ([place]): 0 _________________________________________________________ Nodule # 1: Location: Right; Inferior Maximum size: 1.4 cm; Other 2 dimensions: 1.0 cm x 1.0 cm Composition: solid/almost completely solid (2) Echogenicity: isoechoic (1) Shape: taller-than-wide (3) Margins: ill-defined (0) Echogenic foci: none (0) ACR TI-RADS total points: 6. ACR TI-RADS risk category: TR4 (4-6 points). ACR TI-RADS recommendations: Nodule meets criteria for surveillance _________________________________________________________ No adenopathy IMPRESSION: Right inferior thyroid nodule meets criteria for surveillance, as designated by the newly established ACR TI-RADS criteria. Surveillance ultrasound study recommended to be performed annually up to 5 years. Recommendations follow those established by the new ACR TI-RADS criteria (J Am Coll Radiol [id number]). Electronically Signed   By: Corrie Mckusick D.O.   On: 12/26/2021 09:35   CT Soft Tissue Neck W Contrast  Result Date: 12/25/2021 CLINICAL DATA:  Soft tissue swelling, infection suspected. Symptoms began today. EXAM: CT NECK WITH CONTRAST  TECHNIQUE: Multidetector CT imaging of the neck was performed using the standard protocol following the bolus administration of intravenous contrast. RADIATION DOSE REDUCTION: This exam was performed according to the departmental dose-optimization program which includes exposure control, adjustment of  the mA and/or kV according to patient size and/or use of iterative reconstruction technique. CONTRAST:  71mL OMNIPAQUE IOHEXOL 300 MG/ML  SOLN COMPARISON:  None FINDINGS: Pharynx and larynx: There appears to be swelling of the pharyngeal tissues and tonsils. No tonsillar or peritonsillar abscess. There is pronounced inflammatory change in the parapharyngeal space and in the retropharyngeal space. Low-density material in the retropharyngeal space measures up to 9 mm in thickness and is consistent with pus or inflammatory edema. Salivary glands: Parotid and submandibular glands are normal. Thyroid: 17 mm nodule in the posterior right lobe. Lymph nodes: No lymphadenopathy at this time. Vascular: Normal Limited intracranial: Normal Visualized orbits: Normal Mastoids and visualized paranasal sinuses: Clear Skeleton: Normal Upper chest: Normal Other: None IMPRESSION: Pharyngitis and tonsillitis without evidence of tonsillar abscess or peritonsillar abscess. Pronounced inflammatory change in the parapharyngeal space in the retropharyngeal space. Fluid and or pus dissecting within the retropharyngeal space from C2-T1, maximal thickness 9 mm. 17 mm nodule in the posterior right lobe of the thyroid. Recommend thyroid US (ref: J Am Coll Radiol. 2015 Feb;12(2): 143-50). Electronically Signed   By: Paulina Fusi M.D.   On: 12/25/2021 19:52    EZM:OQHUTMLY except as listed in admit H&P  Blood pressure (!) 129/104, pulse 62, temperature 98.4 F (36.9 C), temperature source Oral, resp. rate 16, height 5\' 5"  (1.651 m), weight 81.6 kg, SpO2 100 %.  PHYSICAL EXAM: Overall appearance:  Healthy appearing, in no distress, eating  lunch.  Having no difficulty with swallowing. Head:  Normocephalic, atraumatic. Ears: External ears are healthy. Nose: External nose is healthy in appearance. Internal nasal exam free of any lesions or obstruction. Oral Cavity/Pharynx:  There are no mucosal lesions or masses identified.  There is no pharyngeal edema.  She is not having difficulty swallowing secretions.  There are multiple missing molars including some fractured off mandibular molars, anterior dentition upper and lower with orthodontic work. Larynx/Hypopharynx: Deferred Neuro:  No identifiable neurologic deficits. Neck: No palpable neck masses.  Studies Reviewed: CT neck reviewed  Procedures: none   Assessment/Plan: Based on the history, lack of leukocytosis, and today's examination, it sounds as though she may have had an angioedema reaction possibly to an ACE inhibitor.  Recommend she stay off ACE inhibitor and ARB medication for life.  Recommend continue medical treatment.  She is in no danger right now of airway or swallowing difficulty and surgical intervention will not be necessary.  She may follow-up as an outpatient as needed.  Recommend she proceed with dental care as soon as possible.   T78.3XXA  Medical Decision Making: #/Complex Problems: 3  Data Reviewed:2  Management:3 (1-Straightforward, 2-Low, 3-Moderate, 4-High)   12/27/2021, 9:39 AM

## 2021-12-27 NOTE — Discharge Summary (Signed)
Physician Discharge Summary  [name] [id number] DOB: February 09, 1981 DOA: 12/25/2021  PCP: Claire Shown, NP  Admit date: 12/25/2021 Discharge date: 12/27/2021  Admitted From: Home Disposition: Home  Recommendations for Outpatient Follow-up:  Follow up with PCP in 1-2 weeks Follow-up with the ENT if persistent symptoms  Home Health: N/A Equipment/Devices: N/A  Discharge Condition: Stable CODE STATUS: Full code Diet recommendation: Low-salt diet  Discharge summary: Patient has history of hypertension and takes lisinopril hydrochlorothiazide.  She started having left facial swelling about 3 days ago that is spread to submandibular region bilaterally.  Swelling sensation in her throat and globus sensation.  Seen at [facility] emergency department and transferred to [facility] for admission and treatment with ENT consultation.  Afebrile.  WBC count were normal.  CT scan consistent with retropharyngeal swelling and edema.  Inflammatory pharyngitis/angioedema: Admitted and treated with injectable dexamethasone, IV antibiotics with Rocephin.  Symptoms improving and currently stabilized.  Seen by ENT and they advised that this is possibly related to angioedema.  With good clinical recovery, discharging home with 7 additional days of oral antibiotics with Augmentin.  She will also finish slow taper of dexamethasone over next 6 days.  She will seek immediate help if any worsening throat pain or swallowing difficulties. Patient will stop taking any ACE inhibitors or ARB, updated as allergy and medical records.   Discharge Diagnoses:  Principal Problem:   Pharyngitis Active Problems:   Essential hypertension   Hypokalemia   Tonsillitis   Thyroid nodule    Discharge Instructions  Discharge Instructions     Call MD for:  difficulty breathing, headache or visual disturbances   Complete by: As directed    Call MD for:  redness, tenderness, or signs of infection (pain, swelling,  redness, odor or green/yellow discharge around incision site)   Complete by: As directed    Call MD for:  severe uncontrolled pain   Complete by: As directed    Diet - low sodium heart healthy   Complete by: As directed    Increase activity slowly   Complete by: As directed       Allergies as of 12/27/2021       Reactions   Ace Inhibitors Swelling   Throat swelling         Medication List     STOP taking these medications    lisinopril-hydrochlorothiazide 20-25 MG tablet Commonly known as: ZESTORETIC       TAKE these medications    amLODipine 10 MG tablet Commonly known as: NORVASC Take 1 tablet (10 mg total) by mouth daily.   amoxicillin-clavulanate 875-125 MG tablet Commonly known as: AUGMENTIN Take 1 tablet by mouth 2 (two) times daily for 7 days.   dexamethasone 4 MG tablet Commonly known as: DECADRON 1 tab twice daily for 2 days 1 tab daily for 2 days 1/2 tab daily for 2 days and stop   diphenhydrAMINE 25 MG tablet Commonly known as: BENADRYL Take 25 mg by mouth every 6 (six) hours as needed for itching or allergies.   potassium chloride SA 20 MEQ tablet Commonly known as: KLOR-CON M Take 1 tablet (20 mEq total) by mouth 2 (two) times daily.        Follow-up Information     [facility], Trenton Gammon, NP Follow up in 1 week(s).   Specialty: Nurse Practitioner Contact information: [address] [place] [address] [phone]  Allergies  Allergen Reactions   Ace Inhibitors Swelling    Throat swelling     Consultations: ENT   Procedures/Studies: US THYROID  Result Date: 12/26/2021 CLINICAL DATA:  41 year old female with a history of thyroid nodule EXAM: THYROID ULTRASOUND TECHNIQUE: Ultrasound examination of the thyroid gland and adjacent soft tissues was performed. COMPARISON:  None FINDINGS: Parenchymal Echotexture: Mildly heterogenous Isthmus: 0.2 cm Right lobe: 5.2 cm x 2.4 cm x 1.5 cm Left lobe: 4.8 cm x 1.8  cm x 1.8 cm _________________________________________________________ Estimated total number of nodules >/= 1 cm: 1 Number of spongiform nodules >/=  2 cm not described below (TR1): 0 Number of mixed cystic and solid nodules >/= 1.5 cm not described below ([place]): 0 _________________________________________________________ Nodule # 1: Location: Right; Inferior Maximum size: 1.4 cm; Other 2 dimensions: 1.0 cm x 1.0 cm Composition: solid/almost completely solid (2) Echogenicity: isoechoic (1) Shape: taller-than-wide (3) Margins: ill-defined (0) Echogenic foci: none (0) ACR TI-RADS total points: 6. ACR TI-RADS risk category: TR4 (4-6 points). ACR TI-RADS recommendations: Nodule meets criteria for surveillance _________________________________________________________ No adenopathy IMPRESSION: Right inferior thyroid nodule meets criteria for surveillance, as designated by the newly established ACR TI-RADS criteria. Surveillance ultrasound study recommended to be performed annually up to 5 years. Recommendations follow those established by the new ACR TI-RADS criteria (J Am Coll Radiol [id number]). Electronically Signed   By: Corrie Mckusick D.O.   On: 12/26/2021 09:35   CT Soft Tissue Neck W Contrast  Result Date: 12/25/2021 CLINICAL DATA:  Soft tissue swelling, infection suspected. Symptoms began today. EXAM: CT NECK WITH CONTRAST TECHNIQUE: Multidetector CT imaging of the neck was performed using the standard protocol following the bolus administration of intravenous contrast. RADIATION DOSE REDUCTION: This exam was performed according to the departmental dose-optimization program which includes exposure control, adjustment of the mA and/or kV according to patient size and/or use of iterative reconstruction technique. CONTRAST:  59mL OMNIPAQUE IOHEXOL 300 MG/ML  SOLN COMPARISON:  None FINDINGS: Pharynx and larynx: There appears to be swelling of the pharyngeal tissues and tonsils. No tonsillar or peritonsillar  abscess. There is pronounced inflammatory change in the parapharyngeal space and in the retropharyngeal space. Low-density material in the retropharyngeal space measures up to 9 mm in thickness and is consistent with pus or inflammatory edema. Salivary glands: Parotid and submandibular glands are normal. Thyroid: 17 mm nodule in the posterior right lobe. Lymph nodes: No lymphadenopathy at this time. Vascular: Normal Limited intracranial: Normal Visualized orbits: Normal Mastoids and visualized paranasal sinuses: Clear Skeleton: Normal Upper chest: Normal Other: None IMPRESSION: Pharyngitis and tonsillitis without evidence of tonsillar abscess or peritonsillar abscess. Pronounced inflammatory change in the parapharyngeal space in the retropharyngeal space. Fluid and or pus dissecting within the retropharyngeal space from C2-T1, maximal thickness 9 mm. 17 mm nodule in the posterior right lobe of the thyroid. Recommend thyroid US (ref: J Am Coll Radiol. 2015 Feb;12(2): 143-50). Electronically Signed   By: Nelson Chimes M.D.   On: 12/25/2021 19:52   (Echo, Carotid, EGD, Colonoscopy, ERCP)    Subjective: Patient seen and examined.  When I went to examine her in the morning rounds she had just talked to ENT surgeon who recommended steroid and antibiotics and conservative management.  Eager to go home.  Tells me that swelling has improved and has no trouble swallowing.   Discharge Exam: Vitals:   12/27/21 0400 12/27/21 0900  BP: (!) 167/92 (!) 129/104  Pulse:    Resp: 16 16  Temp: 98.7 F (  37.1 C) 98.4 F (36.9 C)  SpO2: 100% 100%   Vitals:   12/26/21 2100 12/27/21 0010 12/27/21 0400 12/27/21 0900  BP: (!) 162/92 (!) 162/100 (!) 167/92 (!) 129/104  Pulse: 61 62    Resp: 16 20 16 16   Temp: 98.1 F (36.7 C) 97.6 F (36.4 C) 98.7 F (37.1 C) 98.4 F (36.9 C)  TempSrc: Oral Oral Oral Oral  SpO2: 99% 100% 100% 100%  Weight:      Height:        General: Pt is alert, awake, not in acute  distress Cardiovascular: RRR, S1/S2 +, no rubs, no gallops Respiratory: CTA bilaterally, no wheezing, no rhonchi Abdominal: Soft, NT, ND, bowel sounds + Extremities: no edema, no cyanosis Neck exam: Mild diffuse swelling of the both submandibular area with fullness. Oropharynx, tonsils with no obvious swelling.    The results of significant diagnostics from this hospitalization (including imaging, microbiology, ancillary and laboratory) are listed below for reference.     Microbiology: Recent Results (from the past 240 hour(s))  Group A Strep by PCR     Status: None   Collection Time: 12/25/21  8:32 PM   Specimen: Throat; Sterile Swab  Result Value Ref Range Status   Group A Strep by PCR NOT DETECTED NOT DETECTED Final    Comment: Performed at [facility], [address] [place], [place]  SARS Coronavirus 2 by RT PCR (hospital order, performed in [facility] hospital lab) *cepheid single result test* Throat     Status: None   Collection Time: 12/26/21  8:12 AM   Specimen: Throat; Nasal Swab  Result Value Ref Range Status   SARS Coronavirus 2 by RT PCR NEGATIVE NEGATIVE Final    Comment: (NOTE) SARS-CoV-2 target nucleic acids are NOT DETECTED.  The SARS-CoV-2 RNA is generally detectable in upper and lower respiratory specimens during the acute phase of infection. The lowest concentration of SARS-CoV-2 viral copies this assay can detect is 250 copies / mL. A negative result does not preclude SARS-CoV-2 infection and should not be used as the sole basis for treatment or other patient management decisions.  A negative result may occur with improper specimen collection / handling, submission of specimen other than nasopharyngeal swab, presence of viral mutation(s) within the areas targeted by this assay, and inadequate number of viral copies (<250 copies / mL). A negative result must be combined with clinical observations, patient history, and epidemiological  information.  Fact Sheet for Patients:   02/25/22  Fact Sheet for Healthcare Providers: RoadLapTop.co.za  This test is not yet approved or  cleared by the http://kim-miller.com/ FDA and has been authorized for detection and/or diagnosis of SARS-CoV-2 by FDA under an Emergency Use Authorization (EUA).  This EUA will remain in effect (meaning this test can be used) for the duration of the COVID-19 declaration under Section 564(b)(1) of the Act, 21 U.S.C. section 360bbb-3(b)(1), unless the authorization is terminated or revoked sooner.  Performed at [facility], [address] [place], [place]      Labs: BNP (last 3 results) No results for input(s): "BNP" in the last 8760 hours. Basic Metabolic Panel: Recent Labs  Lab 12/25/21 1706 12/26/21 0420 12/27/21 0433  NA 138 134* 135  K 2.9* 3.3* 3.4*  CL 106 104 104  CO2 26 20* 24  GLUCOSE 95 153* 114*  BUN 12 11 12   CREATININE 0.86 0.76 0.71  CALCIUM 8.9 8.9 9.1  MG  --  1.7  --  PHOS  --  2.1*  --    Liver Function Tests: Recent Labs  Lab 12/26/21 0420  AST 36  ALT 25  ALKPHOS 80  BILITOT 0.4  PROT 8.3*  ALBUMIN 3.3*   No results for input(s): "LIPASE", "AMYLASE" in the last 168 hours. No results for input(s): "AMMONIA" in the last 168 hours. CBC: Recent Labs  Lab 12/25/21 1706 12/26/21 0420 12/27/21 0433  WBC 5.6 4.6 8.0  NEUTROABS 2.7  --   --   HGB 12.4 13.0 12.3  HCT 37.5 40.4 37.0  MCV 88.9 89.0 88.3  PLT 232 235 236   Cardiac Enzymes: No results for input(s): "CKTOTAL", "CKMB", "CKMBINDEX", "TROPONINI" in the last 168 hours. BNP: Invalid input(s): "POCBNP" CBG: No results for input(s): "GLUCAP" in the last 168 hours. D-Dimer No results for input(s): "DDIMER" in the last 72 hours. Hgb A1c No results for input(s): "HGBA1C" in the last 72 hours. Lipid Profile No results for input(s): "CHOL", "HDL", "LDLCALC", "TRIG", "CHOLHDL",  "LDLDIRECT" in the last 72 hours. Thyroid function studies No results for input(s): "TSH", "T4TOTAL", "T3FREE", "THYROIDAB" in the last 72 hours.  Invalid input(s): "FREET3" Anemia work up No results for input(s): "VITAMINB12", "FOLATE", "FERRITIN", "TIBC", "IRON", "RETICCTPCT" in the last 72 hours. Urinalysis    Component Value Date/Time   COLORURINE YELLOW 11/06/2018 2124   APPEARANCEUR HAZY (A) 11/06/2018 2124   LABSPEC 1.013 11/06/2018 2124   PHURINE 6.0 11/06/2018 2124   GLUCOSEU NEGATIVE 11/06/2018 2124   HGBUR MODERATE (A) 11/06/2018 2124   BILIRUBINUR NEGATIVE 11/06/2018 2124   KETONESUR NEGATIVE 11/06/2018 2124   PROTEINUR NEGATIVE 11/06/2018 2124   UROBILINOGEN 0.2 03/12/2008 1235   NITRITE NEGATIVE 11/06/2018 2124   LEUKOCYTESUR NEGATIVE 11/06/2018 2124   Sepsis Labs Recent Labs  Lab 12/25/21 1706 12/26/21 0420 12/27/21 0433  WBC 5.6 4.6 8.0   Microbiology Recent Results (from the past 240 hour(s))  Group A Strep by PCR     Status: None   Collection Time: 12/25/21  8:32 PM   Specimen: Throat; Sterile Swab  Result Value Ref Range Status   Group A Strep by PCR NOT DETECTED NOT DETECTED Final    Comment: Performed at [facility], [address] [place], [place] [address]  SARS Coronavirus 2 by RT PCR (hospital order, performed in [facility] hospital lab) *cepheid single result test* Throat     Status: None   Collection Time: 12/26/21  8:12 AM   Specimen: Throat; Nasal Swab  Result Value Ref Range Status   SARS Coronavirus 2 by RT PCR NEGATIVE NEGATIVE Final    Comment: (NOTE) SARS-CoV-2 target nucleic acids are NOT DETECTED.  The SARS-CoV-2 RNA is generally detectable in upper and lower respiratory specimens during the acute phase of infection. The lowest concentration of SARS-CoV-2 viral copies this assay can detect is 250 copies / mL. A negative result does not preclude SARS-CoV-2 infection and should not be used as the sole basis for treatment or  other patient management decisions.  A negative result may occur with improper specimen collection / handling, submission of specimen other than nasopharyngeal swab, presence of viral mutation(s) within the areas targeted by this assay, and inadequate number of viral copies (<250 copies / mL). A negative result must be combined with clinical observations, patient history, and epidemiological information.  Fact Sheet for Patients:   RoadLapTop.co.za  Fact Sheet for Healthcare Providers: http://kim-miller.com/  This test is not yet approved or  cleared by the [place] FDA and has  been authorized for detection and/or diagnosis of SARS-CoV-2 by FDA under an Emergency Use Authorization (EUA).  This EUA will remain in effect (meaning this test can be used) for the duration of the COVID-19 declaration under Section 564(b)(1) of the Act, 21 U.S.C. section 360bbb-3(b)(1), unless the authorization is terminated or revoked sooner.  Performed at [facility], [address], [place], [place] [address]      Time coordinating discharge: 35 minutes  SIGNED:   Barb Merino, MD  [facility] 12/27/2021, 10:05 AM

## 2021-12-27 NOTE — Progress Notes (Signed)
Patient discharging home. Vital signs stable at time of discharge. Discharge instructions given and verbal understanding returned. No questions at this time.

## 2022-01-01 ENCOUNTER — Encounter: Payer: Self-pay | Admitting: Nurse Practitioner

## 2022-01-01 ENCOUNTER — Ambulatory Visit (INDEPENDENT_AMBULATORY_CARE_PROVIDER_SITE_OTHER): Payer: BC Managed Care – PPO | Admitting: Nurse Practitioner

## 2022-01-01 DIAGNOSIS — E876 Hypokalemia: Secondary | ICD-10-CM | POA: Diagnosis not present

## 2022-01-01 DIAGNOSIS — I10 Essential (primary) hypertension: Secondary | ICD-10-CM

## 2022-01-01 MED ORDER — POTASSIUM CHLORIDE CRYS ER 20 MEQ PO TBCR: 20.0000 meq | EXTENDED_RELEASE_TABLET | Freq: Three times a day (TID) | ORAL | 3 refills | Status: AC

## 2022-01-01 MED ORDER — HYDROCHLOROTHIAZIDE 12.5 MG PO TABS: 12.5000 mg | ORAL_TABLET | Freq: Every day | ORAL | 3 refills | Status: AC

## 2022-01-01 NOTE — Progress Notes (Signed)
Subjective:    Patient ID: [name], female    DOB: 1980/09/25, 41 y.o.   MRN: [id number]  HPI  Patient here for hospital follow-up.  Patient was hospitalized with angioedema.  Lisinopril was subsequently stopped.  Patient states a that blood pressure at home runs about 153/80.  Patient states that she is feeling better however she does state that she knows that her blood pressure is higher than normal.  Patient has been out of work since July 31 and would like to go back to work.   Patient denies any swelling to face neck throat or ankles.  Patient also denies chest pain, shortness of breath, dizziness, headaches, changes to vision, or weakness.   Review of Systems  All other systems reviewed and are negative.      Objective:   Physical Exam Vitals reviewed.  Constitutional:      General: She is not in acute distress.    Appearance: Normal appearance. She is obese. She is not ill-appearing, toxic-appearing or diaphoretic.  HENT:     Head: Normocephalic and atraumatic.  Cardiovascular:     Rate and Rhythm: Normal rate and regular rhythm.     Pulses: Normal pulses.     Heart sounds: Normal heart sounds. No murmur heard. Pulmonary:     Effort: Pulmonary effort is normal. No respiratory distress.     Breath sounds: Normal breath sounds. No wheezing.  Musculoskeletal:     Right lower leg: No edema.     Left lower leg: No edema.     Comments: Grossly intact  Skin:    General: Skin is warm.     Capillary Refill: Capillary refill takes less than 2 seconds.  Neurological:     Mental Status: She is alert.     Comments: Grossly intact  Psychiatric:        Mood and Affect: Mood normal.        Behavior: Behavior normal.           Assessment & Plan:   1. Primary hypertension -Blood pressure was initially 202/122.  Upon recheck blood pressure was 182/100. -Patient denies any headache, shortness of breath, chest pain, swelling. -Will start patient on  hydrochlorothiazide 12.5 -Patient may increase hydrochlorothiazide to 25 mg in 1 week if blood pressure still not at goal of 140/90 - hydrochlorothiazide (HYDRODIURIL) 12.5 MG tablet; Take 1 tablet (12.5 mg total) by mouth daily.  Dispense: 90 tablet; Refill: 3 -Due to patient's history of hypokalemia we will increase patient's potassium from twice a day to 3 times a day. - potassium chloride SA (KLOR-CON M) 20 MEQ tablet; Take 1 tablet (20 mEq total) by mouth 3 (three) times daily.  Dispense: 90 tablet; Refill: 3 -Return to clinic in 2 weeks. -Will recheck CMP and potassium levels along with a magnesium at that time  2. Hypokalemia -Last potassium level was 3.4 -Will increase patient's current potassium prescription from 2 times a day to 3 times a day. - potassium chloride SA (KLOR-CON M) 20 MEQ tablet; Take 1 tablet (20 mEq total) by mouth 3 (three) times daily.  Dispense: 90 tablet; Refill: 3 -Return to clinic in 2 weeks -Will recheck potassium and magnesium level at that time.   Return to work note completed and presented to patient.   Note:  This document was prepared using Dragon voice recognition software and may include unintentional dictation errors. Note - This record has been created using [employer].  Chart creation errors have been  sought, but may not always  have been located. Such creation errors do not reflect on  the standard of medical care.

## 2022-01-10 ENCOUNTER — Telehealth: Payer: Self-pay | Admitting: Nurse Practitioner

## 2022-01-10 NOTE — Telephone Encounter (Signed)
Patient had some disability forms faxed over to be completed in your yellow folder

## 2022-01-17 ENCOUNTER — Ambulatory Visit: Payer: BC Managed Care – PPO | Admitting: Nurse Practitioner

## 2022-01-23 ENCOUNTER — Other Ambulatory Visit: Payer: Self-pay | Admitting: Nurse Practitioner

## 2022-01-23 DIAGNOSIS — E041 Nontoxic single thyroid nodule: Secondary | ICD-10-CM

## 2022-01-24 NOTE — Telephone Encounter (Signed)
Up front for pick up 

## 2022-03-08 ENCOUNTER — Ambulatory Visit: Payer: Self-pay | Admitting: Nurse Practitioner
